# Patient Record
Sex: Female | Born: 1970 | Race: White | Hispanic: No | Marital: Married | Smoking: Never smoker
Health system: Southern US, Community
[De-identification: ages and names within clinical notes are randomized; demographics above are authoritative.]

## PROBLEM LIST (undated history)

## (undated) DIAGNOSIS — R112 Nausea with vomiting, unspecified: Secondary | ICD-10-CM

## (undated) DIAGNOSIS — Z9889 Other specified postprocedural states: Secondary | ICD-10-CM

## (undated) DIAGNOSIS — N920 Excessive and frequent menstruation with regular cycle: Secondary | ICD-10-CM

## (undated) DIAGNOSIS — E039 Hypothyroidism, unspecified: Secondary | ICD-10-CM

## (undated) DIAGNOSIS — R011 Cardiac murmur, unspecified: Secondary | ICD-10-CM

## (undated) DIAGNOSIS — F419 Anxiety disorder, unspecified: Secondary | ICD-10-CM

## (undated) DIAGNOSIS — K219 Gastro-esophageal reflux disease without esophagitis: Secondary | ICD-10-CM

## (undated) DIAGNOSIS — I341 Nonrheumatic mitral (valve) prolapse: Secondary | ICD-10-CM

## (undated) DIAGNOSIS — J45909 Unspecified asthma, uncomplicated: Secondary | ICD-10-CM

## (undated) DIAGNOSIS — K449 Diaphragmatic hernia without obstruction or gangrene: Secondary | ICD-10-CM

## (undated) DIAGNOSIS — E079 Disorder of thyroid, unspecified: Secondary | ICD-10-CM

## (undated) DIAGNOSIS — T7840XA Allergy, unspecified, initial encounter: Secondary | ICD-10-CM

## (undated) DIAGNOSIS — L52 Erythema nodosum: Secondary | ICD-10-CM

## (undated) DIAGNOSIS — M7660 Achilles tendinitis, unspecified leg: Secondary | ICD-10-CM

## (undated) HISTORY — DX: Excessive and frequent menstruation with regular cycle: N92.0

## (undated) HISTORY — PX: EYE SURGERY: SHX253

## (undated) HISTORY — DX: Anxiety disorder, unspecified: F41.9

## (undated) HISTORY — PX: ENDOMETRIAL ABLATION W/ NOVASURE: SUR434

## (undated) HISTORY — DX: Diaphragmatic hernia without obstruction or gangrene: K44.9

## (undated) HISTORY — DX: Gastro-esophageal reflux disease without esophagitis: K21.9

## (undated) HISTORY — DX: Allergy, unspecified, initial encounter: T78.40XA

## (undated) HISTORY — PX: APPENDECTOMY: SHX54

## (undated) HISTORY — DX: Unspecified asthma, uncomplicated: J45.909

## (undated) HISTORY — DX: Nonrheumatic mitral (valve) prolapse: I34.1

## (undated) HISTORY — DX: Erythema nodosum: L52

---

## 2006-06-03 ENCOUNTER — Ambulatory Visit: Payer: Self-pay | Admitting: Pulmonary Disease

## 2011-02-07 ENCOUNTER — Encounter: Payer: Self-pay | Admitting: Cardiology

## 2011-02-07 ENCOUNTER — Emergency Department (HOSPITAL_COMMUNITY)
Admission: EM | Admit: 2011-02-07 | Discharge: 2011-02-07 | Disposition: A | Discharge status: Home / Self Care | Payer: 59 | Source: Home / Self Care | Attending: Family Medicine | Admitting: Family Medicine

## 2011-02-07 DIAGNOSIS — L501 Idiopathic urticaria: Secondary | ICD-10-CM

## 2011-02-07 HISTORY — DX: Disorder of thyroid, unspecified: E07.9

## 2011-02-07 HISTORY — DX: Achilles tendinitis, unspecified leg: M76.60

## 2011-02-07 MED ORDER — TRIAMCINOLONE ACETONIDE 40 MG/ML IJ SUSP
40.0000 mg | Freq: Once | INTRAMUSCULAR | Status: AC
  Administered 2011-02-07: 40 mg via INTRAMUSCULAR

## 2011-02-07 MED ORDER — METHYLPREDNISOLONE ACETATE 40 MG/ML IJ SUSP
80.0000 mg | Freq: Once | INTRAMUSCULAR | Status: AC
  Administered 2011-02-07: 80 mg via INTRAMUSCULAR

## 2011-02-07 MED ORDER — HYDROXYZINE HCL 25 MG PO TABS: 25.0000 mg | ORAL_TABLET | Freq: Four times a day (QID) | ORAL | Status: AC

## 2011-02-07 MED ORDER — METHYLPREDNISOLONE ACETATE 80 MG/ML IJ SUSP
INTRAMUSCULAR | Status: AC
  Filled 2011-02-07: qty 1

## 2011-02-07 MED ORDER — TRIAMCINOLONE ACETONIDE 40 MG/ML IJ SUSP
INTRAMUSCULAR | Status: AC
  Filled 2011-02-07: qty 5

## 2011-02-07 NOTE — ED Provider Notes (Signed)
History     CSN: [id number] Arrival date & time: 02/07/2011  8:45 AM   First MD Initiated Contact with Patient 02/07/11 [phone]      Chief Complaint  Patient presents with  . Rash    (Consider location/radiation/quality/duration/timing/severity/associated sxs/prior treatment) Patient is a 40 y.o. female presenting with rash.  Rash  This is a new problem. The current episode started 2 days ago. The problem has not changed since onset.The problem is associated with an unknown (h/o similar episodes) factor. There has been no fever. The rash is present on the face, right hand, left hand and torso. The patient is experiencing no pain. Associated symptoms include itching. She has tried antihistamines for the symptoms. The treatment provided mild relief.    Past Medical History  Diagnosis Date  . Thyroid disease   . Asthma   . Achilles tendinitis   . Anemia     resolved    Past Surgical History  Procedure Date  . Endometrial ablation w/ novasure     Family History  Problem Relation Age of Onset  . Diabetes Other     History  Substance Use Topics  . Smoking status: Never Smoker   . Smokeless tobacco: Not on file  . Alcohol Use: Yes     occas    OB History    Grav Para Term Preterm Abortions TAB SAB Ect Mult Living                  Review of Systems  Constitutional: Negative.   HENT: Negative for trouble swallowing.   Respiratory: Negative for choking, chest tightness and shortness of breath.   Cardiovascular: Negative.   Skin: Positive for itching and rash.    Allergies  Penicillins and Sulfa antibiotics  Home Medications   Current Outpatient Rx  Name Route Sig Dispense Refill  . DIPHENHYDRAMINE HCL 50 MG PO TABS Oral Take 50 mg by mouth at bedtime as needed.      Marland Kitchen LEVOTHYROXINE SODIUM 112 MCG PO TABS Oral Take 112 mcg by mouth daily.      Marland Kitchen HYDROXYZINE HCL 25 MG PO TABS Oral Take 1 tablet (25 mg total) by mouth every 6 (six) hours. As needed for hives 30  tablet 0    BP 115/80  Pulse 78  Temp(Src) 98.7 F (37.1 C) (Oral)  Resp 18  SpO2 100%  Physical Exam  Constitutional: She appears well-developed and well-nourished.  HENT:  Head: Normocephalic.  Right Ear: External ear normal.  Left Ear: External ear normal.  Mouth/Throat: Oropharynx is clear and moist.  Cardiovascular: Normal rate, normal heart sounds and intact distal pulses.   Pulmonary/Chest: Effort normal and breath sounds normal. She has no wheezes.  Skin: Skin is warm and dry. Rash noted. Rash is urticarial.    ED Course  Procedures (including critical care time)  Labs Reviewed - No data to display No results found.   1. Urticaria, idiopathic       MDM          Barkley Bruns, MD 02/15/11 [phone]

## 2011-02-07 NOTE — ED Notes (Signed)
Pt reports rash to face, hands, and feet since 6pm Wednesday. Denies eating new food. Denies fever.

## 2012-08-24 ENCOUNTER — Encounter: Payer: Self-pay | Admitting: Certified Nurse Midwife

## 2012-08-25 ENCOUNTER — Encounter: Payer: Self-pay | Admitting: Certified Nurse Midwife

## 2012-08-25 ENCOUNTER — Ambulatory Visit (INDEPENDENT_AMBULATORY_CARE_PROVIDER_SITE_OTHER): Payer: 59 | Admitting: Certified Nurse Midwife

## 2012-08-25 VITALS — BP 90/62 | HR 64 | Ht 62.0 in | Wt 143.0 lb

## 2012-08-25 DIAGNOSIS — Z01419 Encounter for gynecological examination (general) (routine) without abnormal findings: Secondary | ICD-10-CM

## 2012-08-25 DIAGNOSIS — B373 Candidiasis of vulva and vagina: Secondary | ICD-10-CM

## 2012-08-25 MED ORDER — FLUCONAZOLE 150 MG PO TABS: 150.0000 mg | ORAL_TABLET | Freq: Once | ORAL | Status: DC

## 2012-08-25 NOTE — Patient Instructions (Addendum)

## 2012-08-25 NOTE — Progress Notes (Signed)
Patient ID: Sonya Snyder, female   DOB: 08-07-1970, 42 y.o.   MRN: [id number] 42 y.o. [id number] Married Caucasian Fe here for annual exam. Periods scant to known once or twice in the past year.  Experiencing increase discharge, no odor, itching or burning and also slight breast tenderness.?Period related. Recent long extended treated for bulging lumbar disc treated with steroids and pain medication. Improving now with physical therapy. Thyroid medication stable with PCP management. 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 Sees PCP for aex and labs.   No LMP recorded. Patient has had an ablation.          Sexually active: yes  The current method of family planning is ablation.    Exercising: no  The patient does not participate in regular exercise at present. Smoker:  no  Health Maintenance: Pap:  08/25/11 MMG:  never Colonoscopy:  2009, normal BMD:   never TDaP:  2004  Labs: PCP   reports that she has never smoked. She has never used smokeless tobacco. She reports that  drinks alcohol. She reports that she does not use illicit drugs.  Past Medical History  Diagnosis Date  . Asthma   . Achilles tendinitis   . Anemia     resolved  . Thyroid disease     hypothyroid  . MVP (mitral valve prolapse)   . Asthma     exercise induced  . Hiatal hernia   . GERD (gastroesophageal reflux disease)   . Erythema nodosum     with COC's  . Menorrhagia     Past Surgical History  Procedure Laterality Date  . Endometrial ablation w/ novasure    . Ablation  2010  . Appendectomy      Current Outpatient Prescriptions  Medication Sig Dispense Refill  . CALCIUM PO Take by mouth as needed.      . diphenhydrAMINE (BENADRYL) 50 MG tablet Take 50 mg by mouth at bedtime as needed.        Marland Kitchen levothyroxine (SYNTHROID, LEVOTHROID) 112 MCG tablet Take 112 mcg by mouth daily.        . Omeprazole (PRILOSEC PO) Take by mouth as needed.       No current facility-administered medications for this visit.     Family History  Problem Relation Age of Onset  . Diabetes Other   . Thyroid disease Mother   . Depression Mother   . Osteoporosis Mother   . Hypertension Father   . Diabetes Father   . Depression Father   . Thyroid disease Sister     ROS:  Pertinent items are noted in HPI.  Otherwise, a comprehensive ROS was negative.  Exam:   BP 90/62  Pulse 64  Ht 5\' 2"  (1.575 m)  Wt 143 lb (64.864 kg)  BMI 26.15 kg/m2 Height: 5\' 2"  (157.5 cm)  Ht Readings from Last 3 Encounters:  08/25/12 5\' 2"  (1.575 m)    General appearance: alert, cooperative and appears stated age Head: Normocephalic, without obvious abnormality, atraumatic Neck: no adenopathy, supple, symmetrical, trachea midline and thyroid normal to inspection and palpation Lungs: clear to auscultation bilaterally Breasts: normal appearance, no masses or tenderness, No nipple retraction or dimpling, No nipple discharge or bleeding, No axillary or supraclavicular adenopathy Heart: regular rate and rhythm Abdomen: soft, non-tender; no masses,  no organomegaly Extremities: extremities normal, atraumatic, no cyanosis or edema Skin: Skin color, texture, turgor normal. No rashes or lesions Lymph nodes: Cervical, supraclavicular, and axillary nodes normal. No abnormal inguinal nodes palpated Neurologic:  Grossly normal   Pelvic: External genitalia:  no lesions              Urethra:  normal appearing urethra with no masses, tenderness or lesions              Bartholin's and Skene's: normal                 Vagina: normal appearing vagina with normal color and thick white discharge, non odorous  discharge, no lesions  Wet prep taken              Cervix: normal, non tender              Pap taken: no Bimanual Exam:  Uterus:  normal size, contour, position, consistency, mobility, non-tender and anteverted              Adnexa: normal adnexa and no mass, fullness, tenderness               Rectovaginal: Confirms               Anus:   normal sphincter tone, no lesions  Wet Prep positive for yeast , negative BV,trich ph 4.0  A:  Well Woman with normal exam  Contraception none  Yeast vaginitis  P:   Reviewed health and wellness pertinent to exam  Discussed that pregnancy can still occur even though she has had an ablation, patient aware  Reviewed findings, Rx Diflucan see order  Pap smear as per guidelines   Mammogram yearly, information given to schedule pap smear not taken today  counseled on breast self exam, mammography screening, feminine hygiene, adequate intake of calcium and vitamin D, diet and exercise  return annually or prn  An After Visit Summary was printed and given to the patient.  Reviewed, TL

## 2013-08-03 ENCOUNTER — Encounter (HOSPITAL_BASED_OUTPATIENT_CLINIC_OR_DEPARTMENT_OTHER): Payer: Self-pay | Admitting: *Deleted

## 2013-08-03 NOTE — Pre-Procedure Instructions (Signed)
Bring all medications. Pack an overnight bag. 

## 2013-08-09 ENCOUNTER — Encounter (HOSPITAL_BASED_OUTPATIENT_CLINIC_OR_DEPARTMENT_OTHER): Payer: 59 | Admitting: Anesthesiology

## 2013-08-09 ENCOUNTER — Encounter (HOSPITAL_BASED_OUTPATIENT_CLINIC_OR_DEPARTMENT_OTHER)
Admission: RE | Disposition: A | Discharge status: Home / Self Care | Payer: Self-pay | Source: Ambulatory Visit | Attending: Otolaryngology

## 2013-08-09 ENCOUNTER — Encounter (HOSPITAL_BASED_OUTPATIENT_CLINIC_OR_DEPARTMENT_OTHER): Payer: Self-pay

## 2013-08-09 ENCOUNTER — Ambulatory Visit (HOSPITAL_BASED_OUTPATIENT_CLINIC_OR_DEPARTMENT_OTHER)
Admission: RE | Admit: 2013-08-09 | Discharge: 2013-08-10 | Disposition: A | Discharge status: Home / Self Care | Payer: 59 | Source: Ambulatory Visit | Attending: Otolaryngology | Admitting: Otolaryngology

## 2013-08-09 ENCOUNTER — Ambulatory Visit (HOSPITAL_BASED_OUTPATIENT_CLINIC_OR_DEPARTMENT_OTHER): Payer: 59 | Admitting: Anesthesiology

## 2013-08-09 DIAGNOSIS — H902 Conductive hearing loss, unspecified: Secondary | ICD-10-CM | POA: Insufficient documentation

## 2013-08-09 DIAGNOSIS — Z79899 Other long term (current) drug therapy: Secondary | ICD-10-CM | POA: Insufficient documentation

## 2013-08-09 DIAGNOSIS — K219 Gastro-esophageal reflux disease without esophagitis: Secondary | ICD-10-CM | POA: Insufficient documentation

## 2013-08-09 DIAGNOSIS — I059 Rheumatic mitral valve disease, unspecified: Secondary | ICD-10-CM | POA: Insufficient documentation

## 2013-08-09 DIAGNOSIS — H8090 Unspecified otosclerosis, unspecified ear: Secondary | ICD-10-CM | POA: Diagnosis present

## 2013-08-09 DIAGNOSIS — E039 Hypothyroidism, unspecified: Secondary | ICD-10-CM | POA: Insufficient documentation

## 2013-08-09 HISTORY — DX: Other specified postprocedural states: Z98.890

## 2013-08-09 HISTORY — PX: STAPEDECTOMY: SHX2435

## 2013-08-09 HISTORY — DX: Other specified postprocedural states: R11.2

## 2013-08-09 HISTORY — DX: Cardiac murmur, unspecified: R01.1

## 2013-08-09 HISTORY — DX: Hypothyroidism, unspecified: E03.9

## 2013-08-09 SURGERY — STAPEDECTOMY
Anesthesia: General | Site: Ear | Laterality: Left

## 2013-08-09 MED ORDER — HYDROMORPHONE HCL PF 1 MG/ML IJ SOLN
INTRAMUSCULAR | Status: AC
  Filled 2013-08-09: qty 1

## 2013-08-09 MED ORDER — CIPROFLOXACIN-DEXAMETHASONE 0.3-0.1 % OT SUSP
OTIC | Status: AC
Start: 2013-08-09 — End: 2013-08-09
  Filled 2013-08-09: qty 7.5

## 2013-08-09 MED ORDER — OXYCODONE HCL 5 MG/5ML PO SOLN: 5.0000 mg | Freq: Once | ORAL | Status: DC | PRN

## 2013-08-09 MED ORDER — ROCURONIUM BROMIDE 100 MG/10ML IV SOLN
INTRAVENOUS | Status: DC | PRN
  Administered 2013-08-09: 50 mg via INTRAVENOUS

## 2013-08-09 MED ORDER — IBUPROFEN 100 MG/5ML PO SUSP: 400.0000 mg | Freq: Four times a day (QID) | ORAL | Status: DC | PRN

## 2013-08-09 MED ORDER — BACITRACIN ZINC 500 UNIT/GM EX OINT
TOPICAL_OINTMENT | CUTANEOUS | Status: AC
Start: 2013-08-09 — End: 2013-08-09
  Filled 2013-08-09: qty 28.35

## 2013-08-09 MED ORDER — HYDROCODONE-ACETAMINOPHEN 5-325 MG PO TABS
1.0000 | ORAL_TABLET | ORAL | Status: DC | PRN
  Administered 2013-08-09 (×3): 2 via ORAL
  Filled 2013-08-09 (×3): qty 2

## 2013-08-09 MED ORDER — LIDOCAINE-EPINEPHRINE 1 %-1:100000 IJ SOLN
INTRAMUSCULAR | Status: AC
  Filled 2013-08-09: qty 1

## 2013-08-09 MED ORDER — FENTANYL CITRATE 0.05 MG/ML IJ SOLN: 50.0000 ug | INTRAMUSCULAR | Status: DC | PRN

## 2013-08-09 MED ORDER — LEVOTHYROXINE SODIUM 112 MCG PO TABS: 112.0000 ug | ORAL_TABLET | Freq: Every day | ORAL | Status: DC

## 2013-08-09 MED ORDER — EPINEPHRINE HCL 1 MG/ML IJ SOLN
INTRAMUSCULAR | Status: AC
Start: 2013-08-09 — End: 2013-08-09
  Filled 2013-08-09: qty 1

## 2013-08-09 MED ORDER — ONDANSETRON HCL 4 MG/2ML IJ SOLN
INTRAMUSCULAR | Status: DC | PRN
  Administered 2013-08-09 (×2): 4 mg via INTRAVENOUS

## 2013-08-09 MED ORDER — MIDAZOLAM HCL 2 MG/2ML IJ SOLN: 1.0000 mg | INTRAMUSCULAR | Status: DC | PRN

## 2013-08-09 MED ORDER — LACTATED RINGERS IV SOLN
INTRAVENOUS | Status: DC | PRN
  Administered 2013-08-09 (×2): via INTRAVENOUS

## 2013-08-09 MED ORDER — FENTANYL CITRATE 0.05 MG/ML IJ SOLN
INTRAMUSCULAR | Status: AC
  Filled 2013-08-09: qty 6

## 2013-08-09 MED ORDER — PROMETHAZINE HCL 25 MG RE SUPP
25.0000 mg | Freq: Four times a day (QID) | RECTAL | Status: DC | PRN
Start: 2013-08-09 — End: 2013-08-10

## 2013-08-09 MED ORDER — BACITRACIN ZINC 500 UNIT/GM EX OINT
TOPICAL_OINTMENT | CUTANEOUS | Status: DC | PRN
  Administered 2013-08-09: 1 via TOPICAL

## 2013-08-09 MED ORDER — DEXAMETHASONE SODIUM PHOSPHATE 4 MG/ML IJ SOLN
INTRAMUSCULAR | Status: DC | PRN
  Administered 2013-08-09: 10 mg via INTRAVENOUS

## 2013-08-09 MED ORDER — LACTATED RINGERS IV SOLN
INTRAVENOUS | Status: DC
  Administered 2013-08-09: 12:00:00 via INTRAVENOUS

## 2013-08-09 MED ORDER — METHYLENE BLUE 1 % INJ SOLN
INTRAMUSCULAR | Status: DC | PRN
  Administered 2013-08-09: 1 mL

## 2013-08-09 MED ORDER — DEXTROSE-NACL 5-0.9 % IV SOLN
INTRAVENOUS | Status: DC
  Administered 2013-08-09: 14:00:00 via INTRAVENOUS

## 2013-08-09 MED ORDER — PROMETHAZINE HCL 25 MG PO TABS
25.0000 mg | ORAL_TABLET | Freq: Four times a day (QID) | ORAL | Status: DC | PRN
  Administered 2013-08-09: 25 mg via ORAL
  Filled 2013-08-09: qty 1

## 2013-08-09 MED ORDER — FENTANYL CITRATE 0.05 MG/ML IJ SOLN
INTRAMUSCULAR | Status: DC | PRN
  Administered 2013-08-09: 100 ug via INTRAVENOUS

## 2013-08-09 MED ORDER — DIAZEPAM 5 MG PO TABS
5.0000 mg | ORAL_TABLET | Freq: Four times a day (QID) | ORAL | Status: DC | PRN
  Administered 2013-08-09 – 2013-08-10 (×2): 5 mg via ORAL
  Filled 2013-08-09 (×2): qty 1

## 2013-08-09 MED ORDER — OXYCODONE HCL 5 MG PO TABS: 5.0000 mg | ORAL_TABLET | Freq: Once | ORAL | Status: DC | PRN

## 2013-08-09 MED ORDER — ONDANSETRON HCL 4 MG/2ML IJ SOLN: 4.0000 mg | Freq: Once | INTRAMUSCULAR | Status: DC | PRN

## 2013-08-09 MED ORDER — HYDROMORPHONE HCL PF 1 MG/ML IJ SOLN
0.2500 mg | INTRAMUSCULAR | Status: DC | PRN
  Administered 2013-08-09: 0.5 mg via INTRAVENOUS

## 2013-08-09 MED ORDER — LIDOCAINE-EPINEPHRINE 1 %-1:100000 IJ SOLN
INTRAMUSCULAR | Status: DC | PRN
  Administered 2013-08-09: .5 mL

## 2013-08-09 MED ORDER — SCOPOLAMINE 1 MG/3DAYS TD PT72
1.0000 | MEDICATED_PATCH | TRANSDERMAL | Status: DC
  Administered 2013-08-09: 1.5 mg via TRANSDERMAL

## 2013-08-09 MED ORDER — PANTOPRAZOLE SODIUM 40 MG PO TBEC
40.0000 mg | DELAYED_RELEASE_TABLET | Freq: Every day | ORAL | Status: DC
Start: 2013-08-09 — End: 2013-08-10

## 2013-08-09 MED ORDER — MIDAZOLAM HCL 5 MG/5ML IJ SOLN
INTRAMUSCULAR | Status: DC | PRN
  Administered 2013-08-09: 2 mg via INTRAVENOUS

## 2013-08-09 MED ORDER — NEOSTIGMINE METHYLSULFATE 10 MG/10ML IV SOLN
INTRAVENOUS | Status: DC | PRN
  Administered 2013-08-09: 3 mg via INTRAVENOUS

## 2013-08-09 MED ORDER — CIPROFLOXACIN-DEXAMETHASONE 0.3-0.1 % OT SUSP
OTIC | Status: DC | PRN
  Administered 2013-08-09: 4 [drp] via OTIC

## 2013-08-09 MED ORDER — SCOPOLAMINE 1 MG/3DAYS TD PT72
MEDICATED_PATCH | TRANSDERMAL | Status: AC
  Filled 2013-08-09: qty 1

## 2013-08-09 MED ORDER — EPINEPHRINE HCL 1 MG/ML IJ SOLN
INTRAMUSCULAR | Status: DC | PRN
  Administered 2013-08-09: 1 mg

## 2013-08-09 MED ORDER — METHYLENE BLUE 1 % INJ SOLN
INTRAMUSCULAR | Status: AC
Start: 2013-08-09 — End: 2013-08-09
  Filled 2013-08-09: qty 10

## 2013-08-09 MED ORDER — PROPOFOL 10 MG/ML IV BOLUS
INTRAVENOUS | Status: DC | PRN
  Administered 2013-08-09: 180 mg via INTRAVENOUS

## 2013-08-09 MED ORDER — LIDOCAINE HCL (CARDIAC) 20 MG/ML IV SOLN
INTRAVENOUS | Status: DC | PRN
  Administered 2013-08-09: 80 mg via INTRAVENOUS

## 2013-08-09 MED ORDER — GLYCOPYRROLATE 0.2 MG/ML IJ SOLN
INTRAMUSCULAR | Status: DC | PRN
  Administered 2013-08-09: 0.4 mg via INTRAVENOUS

## 2013-08-09 MED ORDER — MIDAZOLAM HCL 2 MG/2ML IJ SOLN
INTRAMUSCULAR | Status: AC
  Filled 2013-08-09: qty 2

## 2013-08-09 SURGICAL SUPPLY — 40 items
ADH SKN CLS APL DERMABOND .7 (GAUZE/BANDAGES/DRESSINGS)
BALL CTTN LRG ABS STRL LF (GAUZE/BANDAGES/DRESSINGS) ×1
BLADE SURG ROTATE 9660 (MISCELLANEOUS) IMPLANT
CANISTER SUCT 1200ML W/VALVE (MISCELLANEOUS) ×2 IMPLANT
CLEANER CAUTERY TIP 5X5 PAD (MISCELLANEOUS) IMPLANT
COTTONBALL LRG STERILE PKG (GAUZE/BANDAGES/DRESSINGS) ×2 IMPLANT
DECANTER SPIKE VIAL GLASS SM (MISCELLANEOUS) ×2 IMPLANT
DERMABOND ADVANCED (GAUZE/BANDAGES/DRESSINGS)
DERMABOND ADVANCED .7 DNX12 (GAUZE/BANDAGES/DRESSINGS) IMPLANT
DRAPE MICROSCOPE URBAN (DRAPES) ×2 IMPLANT
DROPPER MEDICINE STER 1.5ML LF (MISCELLANEOUS) IMPLANT
ELECT COATED BLADE 2.86 ST (ELECTRODE) IMPLANT
ELECT REM PT RETURN 9FT ADLT (ELECTROSURGICAL) ×2
ELECTRODE REM PT RTRN 9FT ADLT (ELECTROSURGICAL) IMPLANT
GLOVE ECLIPSE 7.5 STRL STRAW (GLOVE) ×2 IMPLANT
GLOVE SURG SS PI 7.0 STRL IVOR (GLOVE) ×1 IMPLANT
GOWN STRL REUS W/ TWL LRG LVL3 (GOWN DISPOSABLE) ×1 IMPLANT
GOWN STRL REUS W/ TWL XL LVL3 (GOWN DISPOSABLE) ×1 IMPLANT
GOWN STRL REUS W/TWL LRG LVL3 (GOWN DISPOSABLE) ×2
GOWN STRL REUS W/TWL XL LVL3 (GOWN DISPOSABLE) ×2
IV CATH AUTO 14GX1.75 SAFE ORG (IV SOLUTION) IMPLANT
NDL SAFETY ECLIPSE 18X1.5 (NEEDLE) ×1 IMPLANT
NEEDLE 27GAX1X1/2 (NEEDLE) ×2 IMPLANT
NEEDLE HYPO 18GX1.5 SHARP (NEEDLE) ×2
NS IRRIG 1000ML POUR BTL (IV SOLUTION) ×2 IMPLANT
PACK BASIN DAY SURGERY FS (CUSTOM PROCEDURE TRAY) ×2 IMPLANT
PACK ENT DAY SURGERY (CUSTOM PROCEDURE TRAY) ×2 IMPLANT
PAD CLEANER CAUTERY TIP 5X5 (MISCELLANEOUS)
PENCIL FOOT CONTROL (ELECTRODE) IMPLANT
PISTON CUP LIPPY MOD .4X4.0 SS ×1 IMPLANT
SET EXT MALE ROTATING LL 32IN (MISCELLANEOUS) ×2 IMPLANT
SET IV EXT TUBING FEMALE 31 (MISCELLANEOUS) ×1 IMPLANT
SHEET MEDIUM DRAPE 40X70 STRL (DRAPES) IMPLANT
SLEEVE SCD COMPRESS KNEE MED (MISCELLANEOUS) ×2 IMPLANT
SPONGE SURGIFOAM ABS GEL 12-7 (HEMOSTASIS) IMPLANT
SUT CHROMIC 4 0 P 3 18 (SUTURE) IMPLANT
SUT PLAIN 5 0 P 3 18 (SUTURE) IMPLANT
TOWEL OR 17X24 6PK STRL BLUE (TOWEL DISPOSABLE) ×4 IMPLANT
TOWEL OR NON WOVEN STRL DISP B (DISPOSABLE) ×2 IMPLANT
TRAY DSU PREP LF (CUSTOM PROCEDURE TRAY) ×2 IMPLANT

## 2013-08-09 NOTE — Progress Notes (Signed)
Dr. Pollyann Kennedy  here to examine pt with tuning fork.   Pt, able to hear equally in both ears.

## 2013-08-09 NOTE — Anesthesia Preprocedure Evaluation (Addendum)
Anesthesia Evaluation  Patient identified by MRN, date of birth, ID band Patient awake    Reviewed: Allergy & Precautions, H&P , NPO status , Patient's Chart, lab work & pertinent test results  History of Anesthesia Complications (+) PONV  Airway Mallampati: I TM Distance: >3 FB Neck ROM: Full    Dental  (+) Teeth Intact, Dental Advisory Given   Pulmonary  breath sounds clear to auscultation        Cardiovascular Valvular problems/murmurs: MVP without sx. Rhythm:Regular     Neuro/Psych    GI/Hepatic   Endo/Other    Renal/GU      Musculoskeletal   Abdominal   Peds  Hematology   Anesthesia Other Findings   Reproductive/Obstetrics                          Anesthesia Physical Anesthesia Plan  ASA: II  Anesthesia Plan: General   Post-op Pain Management:    Induction: Intravenous  Airway Management Planned: LMA  Additional Equipment:   Intra-op Plan:   Post-operative Plan: Extubation in OR  Informed Consent: I have reviewed the patients History and Physical, chart, labs and discussed the procedure including the risks, benefits and alternatives for the proposed anesthesia with the patient or authorized representative who has indicated his/her understanding and acceptance.   Dental advisory given  Plan Discussed with: CRNA, Anesthesiologist and Surgeon  Anesthesia Plan Comments:         Anesthesia Quick Evaluation

## 2013-08-09 NOTE — H&P (Signed)
Assessment  Hearing loss (389.9) (H91.90). Tinnitus (388.30) (H93.19). Orders  Audiological Evaluation; Comprehensive Audiometry; Tympanometry Bilateral; Tympanometry Bilateral; Requested for: 20 Jun 2013. Discussed  The patient presents for evaluation of left hearing loss. She reports a 2 month history of left ear fullness, hearing loss and nonpulsatile tinnitus which is worse at night. No prior otologic history, previous surgery, hearing loss, otalgia or otorrhea. She had a recent upper respiratory tract infection which resolved spontaneously without antibiotics. She has a history of allergic rhinitis but has not been particularly bothered this year. No new medications, medical issues are stable. No family history of hearing loss or prior otologic concerns. She is employed at [employer] and a recent audiogram showed left hearing loss. No neurologic symptoms or headache. Exam shows normal external auditory canals and tympanic membranes, no middle ear effusion or infection. Normal neurologic exam. Audiogram shows maximal left conductive hearing loss across all frequencies with normal sensorineural hearing, normal speech discrimination and normal tympanograms.   Patient presents with left hearing loss, history findings and audiogram are consistent with significant left conductive hearing loss, this may represent otosclerosis or ossicular discontinuity. Discussed these findings, recommend evaluation by Dr. Pollyann Kennedy for middle ear exploration and possible stapedectomy. The patient will contact our office to schedule appointment. Followup with me as needed. Reason For Visit  Patient is being seen for left ear discomfort. Allergies  Penicillins Sulfa Antibiotics. Current Meds  Benadryl 25 MG Oral Tablet;TAKE 1 TABLET AT BEDTIME.; RPT Synthroid 112 MCG Oral Tablet (Levothyroxine Sodium);TAKE 1 TABLET DAILY.; RPT Ibuprofen TABS;TAKES PRN; RPT Omeprazole TBEC;Take one capsule daily; RPT. Active Problems   Gastroesophageal reflux disease   (530.81) (K21.9) Hearing loss   (389.9) (H91.90) Hypothyroidism   (244.9) (E03.9) MVP (mitral valve prolapse)   (424.0) (I34.1). PMH  History of endometrial ablation (V12.59) (Z98.89) History of erythema nodosum (V13.3) (Z87.2). PSH  Appendectomy. Family Hx  Family history of deafness or hearing loss: Father,Other (V19.2) (Z82.2) Family history of diabetes mellitus: Father,Other (V18.0) (Z83.3) Family history of malignant neoplasm of prostate: Father,Other (L79.89) (Z80.42) Seasonal allergies: Other (J30.2). Personal Hx  Caffeine use (V49.89) (Z78.9) Never smoker Social alcohol use (F10.99). ROS  12 system ROS was obtained and reviewed on the Health Maintenance form dated today.  Positive responses are shown above.  If the symptom is not checked, the patient has denied it. Vital Signs   Recorded by [facility] on 20 Jun 2013 09:41 AM BP:130/78,  Height: 5 ft 2 in, Weight: 135 lb , BMI: 24.7 kg/m2,  BMI Calculated: 24.69 ,  BSA Calculated: 1.62. Physical Exam  Constitutional:  Patient appears well-nourished and well-developed. No acute distress.   Head/Face: Facial features are symmetric. Skull is normocephalic. Hair and scalp are normal. Normal temporal artery pulses. TMJ shows no joint deformity swelling or erythema.   Eyes: Pupils are equal, round and reactive to light. Conjunctiva and lids are normal. Normal extraocular mobility. Normal vision by patient report.   Ears:     Right: Pinna and external meatus normal, normal ear canal skin and caliber without excessive cerumen or drainage. Tympanic membranes intact without effusion or infection. Hearing normal.    Left: Pinna and external meatus normal, normal ear canal skin and caliber without excessive cerumen or drainage. Tympanic membranes intact without effusion or infection. Hearing normal.   Nose/Sinus/Nasopharynx: Septum is normal. Normal nasal mucosa. Normal inferior  turbinates. Normal middle and superior turbinates, sinus ostia patent without obstruction, mass or discharge. Nasopharynx patent.   Oral cavity/Oropharynx: Lips normal,  teeth and gums normal with good dentition, normal oral vestibule. Normal floor of mouth, tongue and oral mucosa, no mucosal lesions, ulcer or mass, normal tongue mobility.  Hard and soft palate normal with normal mobility. Tonsils normal. Base of tongue, retromolar trigone and oral pharynx normal. Normal sensation, mobility and gag.   Neck: No cervical lymphadenopathy, mass or swelling. Salivary glands normal to palpation without swelling, erythema or mass. Normal facial nerve function. Normal thyroid gland palpation.   Neurological: Alert and oriented to self, place and time.  Normal reflexes and motor skills, balance and coordination.   Psychiatric: No unusual anxiety or evidence of depression. Appropriate affect. . Results  Audiogram shows maximal left conductive hearing loss across all frequencies with normal sensorineural hearing, normal speech discrimination and normal tympanograms. Signature  Electronically signed by : Osborn Cohoavid  Shoemaker  M.D.; 06/20/2013 11:04 AM EST.

## 2013-08-09 NOTE — Transfer of Care (Signed)
Immediate Anesthesia Transfer of Care Note  Patient: Sonya Snyder  Procedure(s) Performed: Procedure(s): LEFT STAPEDECTOMY (Left)  Patient Location: PACU  Anesthesia Type:General  Level of Consciousness: awake and alert   Airway & Oxygen Therapy: Patient Spontanous Breathing and Patient connected to face mask oxygen  Post-op Assessment: Report given to PACU RN and Post -op Vital signs reviewed and stable  Post vital signs: Reviewed and stable  Complications: No apparent anesthesia complications

## 2013-08-09 NOTE — Anesthesia Postprocedure Evaluation (Signed)
  Anesthesia Post-op Note  Patient: Sonya Snyder  Procedure(s) Performed: Procedure(s): LEFT STAPEDECTOMY (Left)  Patient Location: PACU  Anesthesia Type:General  Level of Consciousness: awake, alert  and oriented  Airway and Oxygen Therapy: Patient Spontanous Breathing  Post-op Pain: none  Post-op Assessment: Post-op Vital signs reviewed  Post-op Vital Signs: Reviewed  Last Vitals:  Filed Vitals:   08/09/13 1315  BP: 110/61  Pulse: 58  Temp:   Resp: 12    Complications: No apparent anesthesia complications

## 2013-08-09 NOTE — Op Note (Signed)
OPERATIVE REPORT  DATE OF SURGERY: 08/09/2013  PATIENT:  Sonya Snyder,  43 y.o. female  PRE-OPERATIVE DIAGNOSIS:  CONDUCTIVE HEARING LOSS LEFT OTOSCLEROSIS   POST-OPERATIVE DIAGNOSIS:  CONDUCTIVE HEARING LOSS LEFT OTOSCLEROSIS   PROCEDURE:  Procedure(s): LEFT STAPEDECTOMY  SURGEON:  Susy Frizzle, MD  ASSISTANTS: None  ANESTHESIA:   General   EBL:  5 ml  DRAINS: None   LOCAL MEDICATIONS USED:  1% Xylocaine with epinephrine  SPECIMEN:  Left stapes superstructure  COUNTS:  Correct  PROCEDURE DETAILS: The patient was taken to the operating room and placed on the operating table in the supine position. Following induction of general endotracheal anesthesia, the left ear was prepped and draped in a standard fashion. Xylocaine with epinephrine was infiltrated into 4 quadrants of the external auditory canal. The tragus was also injected. A posteriorly-based tympanomeatal flap was developed with a round knife and brought forward entering the middle ear. The chorda tympani nerve was identified and preserved. The middle ear was clear and healthy. The ossicular chain was inspected and the malleus and incus were freely mobile but the stapes was fixed. A tragal perichondrial graft was harvested. The incision was reapproximated with chromic suture. A curette was used to remove bone off the scutum exposing the oval window. The facial nerve was overhanging and partially obscuring the footplate but there was adequate room for completion of the surgery. Mucosa was dissected off of the fallopian canal, the footplate and the promontory. A small control was created in the center of the footplate. There is no perilymph gusher. The incus stapedial joint was divided. The stapedial muscle tendon was cut. Eustachian tube area the anterior tympanic cavity was packed with saline soaked Gelfoam  pieces. The superstructure was down fractured toward the promontory and removed. The footplate was removed in piecemeal  fashion using a tab knife. The perichondrial graft was put into position overlying the oval window. A 4 mm total length modified bucket-handle prosthesis was then inserted without difficulty and secured in place. There was nice round window reflex with movement of the incus. The flap was brought back to its native position and secured in place with Ciprodex-soaked Gelfoam. Cotton ball with bacitracin was placed at the external meatus. Patient was awakened extubated and transferred to recovery in stable condition.    PATIENT DISPOSITION:  To PACU, stable

## 2013-08-09 NOTE — Anesthesia Procedure Notes (Signed)
Procedure Name: Intubation Date/Time: 08/09/2013 11:55 AM Performed by: Tyrone Nine Pre-anesthesia Checklist: Patient identified, Timeout performed, Emergency Drugs available, Suction available and Patient being monitored Patient Re-evaluated:Patient Re-evaluated prior to inductionOxygen Delivery Method: Circle system utilized Preoxygenation: Pre-oxygenation with 100% oxygen Intubation Type: IV induction Ventilation: Mask ventilation without difficulty Laryngoscope Size: Mac and 3 Grade View: Grade I Tube size: 7.0 mm Number of attempts: 1 Airway Equipment and Method: Stylet Placement Confirmation: ETT inserted through vocal cords under direct vision,  positive ETCO2 and breath sounds checked- equal and bilateral Secured at: 20 cm Tube secured with: Tape Dental Injury: Teeth and Oropharynx as per pre-operative assessment

## 2013-08-09 NOTE — Discharge Instructions (Signed)
Do not blow your nose.  No straining or lifting anything greater than about 5 pounds  Do not allow water to get into the ear.  Change the cotton ball 3 times daily and instilled eardrops.  Open your mouth if you feel a sneeze coming.   Post Anesthesia Home Care Instructions  Activity: Get plenty of rest for the remainder of the day. A responsible adult should stay with you for 24 hours following the procedure.  For the next 24 hours, DO NOT: -Drive a car -Advertising copywriter -Drink alcoholic beverages -Take any medication unless instructed by your physician -Make any legal decisions or sign important papers.  Meals: Start with liquid foods such as gelatin or soup. Progress to regular foods as tolerated. Avoid greasy, spicy, heavy foods. If nausea and/or vomiting occur, drink only clear liquids until the nausea and/or vomiting subsides. Call your physician if vomiting continues.  Special Instructions/Symptoms: Your throat may feel dry or sore from the anesthesia or the breathing tube placed in your throat during surgery. If this causes discomfort, gargle with warm salt water. The discomfort should disappear within 24 hours.

## 2013-08-10 ENCOUNTER — Encounter (HOSPITAL_BASED_OUTPATIENT_CLINIC_OR_DEPARTMENT_OTHER): Payer: Self-pay | Admitting: Otolaryngology

## 2013-08-10 LAB — POCT HEMOGLOBIN-HEMACUE: Hemoglobin: 14.3 g/dL (ref 12.0–15.0)

## 2013-08-10 MED ORDER — PROMETHAZINE HCL 25 MG RE SUPP: 25.0000 mg | Freq: Four times a day (QID) | RECTAL | Status: DC | PRN

## 2013-08-10 MED ORDER — HYDROCODONE-ACETAMINOPHEN 7.5-325 MG PO TABS: 1.0000 | ORAL_TABLET | Freq: Four times a day (QID) | ORAL | Status: DC | PRN

## 2013-08-10 MED ORDER — CIPROFLOXACIN-DEXAMETHASONE 0.3-0.1 % OT SUSP: 3.0000 [drp] | Freq: Three times a day (TID) | OTIC | Status: DC

## 2013-08-10 MED ORDER — DIAZEPAM 5 MG PO TABS: 5.0000 mg | ORAL_TABLET | Freq: Four times a day (QID) | ORAL | Status: DC | PRN

## 2013-08-10 NOTE — Discharge Summary (Signed)
Physician Discharge Summary  Patient ID: Sonya Snyder MRN: [id number] DOB/AGE: May 27, 1970 42 y.o.  Admit date: 08/09/2013 Discharge date: 08/10/2013  Admission Diagnoses:Otosclerosis  Discharge Diagnoses:  Active Problems:   Otosclerosis   Discharged Condition: good  Hospital Course: no complications, minimal dizziness.  Consults: none  Significant Diagnostic Studies: none  Treatments: surgery: Stapedectomy  Discharge Exam: Blood pressure 96/59, pulse 68, temperature 97.3 F (36.3 C), temperature source Oral, resp. rate 16, height 5\' 2"  (1.575 m), weight 139 lb (63.05 kg), SpO2 99.00%. PHYSICAL EXAM: No nystagmus, awake and alert.   Disposition: 01-Home or Self Care  Discharge Instructions   Diet - low sodium heart healthy    Complete by:  As directed      Increase activity slowly    Complete by:  As directed             Medication List         ALBUTEROL IN  Inhale into the lungs. Use as needed     ciprofloxacin-dexamethasone otic suspension  Commonly known as:  CIPRODEX  Place 3 drops into the left ear 3 (three) times daily.     diazepam 5 MG tablet  Commonly known as:  VALIUM  Take 1 tablet (5 mg total) by mouth every 6 (six) hours as needed for anxiety.     diphenhydrAMINE 50 MG tablet  Commonly known as:  BENADRYL  Take 50 mg by mouth at bedtime as needed.     HYDROcodone-acetaminophen 7.5-325 MG per tablet  Commonly known as:  NORCO  Take 1 tablet by mouth every 6 (six) hours as needed for moderate pain.     levothyroxine 112 MCG tablet  Commonly known as:  SYNTHROID, LEVOTHROID  Take 112 mcg by mouth daily.     PRILOSEC PO  Take by mouth as needed.     promethazine 25 MG suppository  Commonly known as:  PHENERGAN  Place 1 suppository (25 mg total) rectally every 6 (six) hours as needed for nausea or vomiting.     SUDAFED 12 HOUR PO  Take by mouth as needed.           Follow-up Information   Follow up with Serena Colonel, MD.  Schedule an appointment as soon as possible for a visit on 08/19/2013.   Specialty:  Otolaryngology   Contact information:   [address] [place] [address] [phone]       Signed: Serena Colonel 08/10/2013, 8:11 AM

## 2013-08-30 ENCOUNTER — Ambulatory Visit: Payer: 59 | Admitting: Certified Nurse Midwife

## 2013-12-16 ENCOUNTER — Other Ambulatory Visit: Payer: Self-pay

## 2014-01-02 ENCOUNTER — Encounter (HOSPITAL_BASED_OUTPATIENT_CLINIC_OR_DEPARTMENT_OTHER): Payer: Self-pay | Admitting: Otolaryngology

## 2016-01-03 ENCOUNTER — Other Ambulatory Visit: Payer: Self-pay | Admitting: Internal Medicine

## 2016-01-03 DIAGNOSIS — Z1231 Encounter for screening mammogram for malignant neoplasm of breast: Secondary | ICD-10-CM

## 2016-02-04 ENCOUNTER — Ambulatory Visit
Admission: RE | Admit: 2016-02-04 | Discharge: 2016-02-04 | Disposition: A | Discharge status: Home / Self Care | Payer: 59 | Source: Ambulatory Visit | Attending: Internal Medicine | Admitting: Internal Medicine

## 2016-02-04 DIAGNOSIS — Z1231 Encounter for screening mammogram for malignant neoplasm of breast: Secondary | ICD-10-CM

## 2016-03-04 DIAGNOSIS — Z0000 Encounter for general adult medical examination without abnormal findings: Secondary | ICD-10-CM | POA: Diagnosis not present

## 2016-03-04 DIAGNOSIS — R7309 Other abnormal glucose: Secondary | ICD-10-CM | POA: Diagnosis not present

## 2016-03-04 DIAGNOSIS — E038 Other specified hypothyroidism: Secondary | ICD-10-CM | POA: Diagnosis not present

## 2016-03-11 DIAGNOSIS — Z0000 Encounter for general adult medical examination without abnormal findings: Secondary | ICD-10-CM | POA: Diagnosis not present

## 2016-03-11 DIAGNOSIS — J3089 Other allergic rhinitis: Secondary | ICD-10-CM | POA: Diagnosis not present

## 2016-03-11 DIAGNOSIS — E038 Other specified hypothyroidism: Secondary | ICD-10-CM | POA: Diagnosis not present

## 2016-03-11 DIAGNOSIS — L52 Erythema nodosum: Secondary | ICD-10-CM | POA: Diagnosis not present

## 2016-03-11 DIAGNOSIS — Z23 Encounter for immunization: Secondary | ICD-10-CM | POA: Diagnosis not present

## 2016-10-21 DIAGNOSIS — W57XXXA Bitten or stung by nonvenomous insect and other nonvenomous arthropods, initial encounter: Secondary | ICD-10-CM | POA: Diagnosis not present

## 2016-10-21 DIAGNOSIS — L03116 Cellulitis of left lower limb: Secondary | ICD-10-CM | POA: Diagnosis not present

## 2017-02-04 DIAGNOSIS — L52 Erythema nodosum: Secondary | ICD-10-CM | POA: Diagnosis not present

## 2017-02-04 DIAGNOSIS — M2550 Pain in unspecified joint: Secondary | ICD-10-CM | POA: Diagnosis not present

## 2017-03-10 DIAGNOSIS — Z0000 Encounter for general adult medical examination without abnormal findings: Secondary | ICD-10-CM | POA: Diagnosis not present

## 2017-03-10 DIAGNOSIS — E038 Other specified hypothyroidism: Secondary | ICD-10-CM | POA: Diagnosis not present

## 2017-03-17 DIAGNOSIS — Z23 Encounter for immunization: Secondary | ICD-10-CM | POA: Diagnosis not present

## 2017-03-17 DIAGNOSIS — E038 Other specified hypothyroidism: Secondary | ICD-10-CM | POA: Diagnosis not present

## 2017-03-17 DIAGNOSIS — Z0000 Encounter for general adult medical examination without abnormal findings: Secondary | ICD-10-CM | POA: Diagnosis not present

## 2017-03-17 DIAGNOSIS — L52 Erythema nodosum: Secondary | ICD-10-CM | POA: Diagnosis not present

## 2017-03-17 DIAGNOSIS — Z1389 Encounter for screening for other disorder: Secondary | ICD-10-CM | POA: Diagnosis not present

## 2017-03-17 DIAGNOSIS — J45990 Exercise induced bronchospasm: Secondary | ICD-10-CM | POA: Diagnosis not present

## 2017-04-24 DIAGNOSIS — Z124 Encounter for screening for malignant neoplasm of cervix: Secondary | ICD-10-CM | POA: Diagnosis not present

## 2017-05-08 DIAGNOSIS — N736 Female pelvic peritoneal adhesions (postinfective): Secondary | ICD-10-CM | POA: Diagnosis not present

## 2017-05-08 DIAGNOSIS — N813 Complete uterovaginal prolapse: Secondary | ICD-10-CM | POA: Diagnosis not present

## 2017-05-08 DIAGNOSIS — R197 Diarrhea, unspecified: Secondary | ICD-10-CM | POA: Diagnosis not present

## 2017-05-08 DIAGNOSIS — E039 Hypothyroidism, unspecified: Secondary | ICD-10-CM | POA: Diagnosis not present

## 2017-05-08 DIAGNOSIS — N393 Stress incontinence (female) (male): Secondary | ICD-10-CM | POA: Diagnosis not present

## 2017-05-08 HISTORY — PX: VAGINAL HYSTERECTOMY: SUR661

## 2017-06-04 DIAGNOSIS — R8290 Unspecified abnormal findings in urine: Secondary | ICD-10-CM | POA: Diagnosis not present

## 2017-06-04 DIAGNOSIS — M62838 Other muscle spasm: Secondary | ICD-10-CM | POA: Diagnosis not present

## 2017-06-11 ENCOUNTER — Other Ambulatory Visit: Payer: Self-pay

## 2017-06-11 ENCOUNTER — Ambulatory Visit: Payer: Commercial Managed Care - PPO | Attending: Obstetrics and Gynecology | Admitting: Physical Therapy

## 2017-06-11 ENCOUNTER — Encounter: Payer: Self-pay | Admitting: Physical Therapy

## 2017-06-11 DIAGNOSIS — M62838 Other muscle spasm: Secondary | ICD-10-CM | POA: Diagnosis not present

## 2017-06-11 DIAGNOSIS — M6281 Muscle weakness (generalized): Secondary | ICD-10-CM | POA: Diagnosis not present

## 2017-06-11 NOTE — Therapy (Signed)
[facility] [address] [place], [place], [address] Phone: [phone]   Fax:  [phone]  Physical Therapy Evaluation  Patient Details  Name: CALINDA STOCKINGER MRN: [id number] Date of Birth: January 23, 1971 Referring Provider: Dr. Lafe Garin   Encounter Date: 06/11/2017  PT End of Session - 06/11/17 1536    Visit Number  1    Date for PT Re-Evaluation  08/06/17    Authorization Type  UHC    PT Start Time  1015    PT Stop Time  1100    PT Time Calculation (min)  45 min    Activity Tolerance  Patient tolerated treatment well    Behavior During Therapy  [facility] for tasks assessed/performed       Past Medical History:  Diagnosis Date  . Achilles tendinitis   . Asthma   . Asthma    exercise induced  . Erythema nodosum    with COC's  . GERD (gastroesophageal reflux disease)   . Heart murmur   . Hiatal hernia   . Hypothyroidism   . Menorrhagia   . MVP (mitral valve prolapse)   . PONV (postoperative nausea and vomiting)   . Thyroid disease    hypothyroid    Past Surgical History:  Procedure Laterality Date  . APPENDECTOMY    . ENDOMETRIAL ABLATION W/ NOVASURE    . EYE SURGERY     [place]ix 2005 done  [place]  . STAPEDECTOMY Left 08/09/2013   Procedure: LEFT STAPEDECTOMY;  Surgeon: Serena Colonel, MD;  Location: [facility];  Service: ENT;  Laterality: Left;  Marland Kitchen VAGINAL HYSTERECTOMY  05/08/2017   added rectocele and cystocele repair, midurethral sling    There were no vitals filed for this visit.   Subjective Assessment - 06/11/17 1025    Subjective  Patient has vaginal hysterectomy with rectocele and cystocele repair, and midurethral sling on 05/08/2017. Next MD appointment on 06/25/2017 therefore no internal work till then. Patient had to self catherize for 11 days.  Patient had pressure for abdominal and anal pressure and urethral pain with urination.  Patient was not emptying her bladder correctly so  had to self cathrize herself.  Patient increased fiber intake and pressure has decreased.  I have a spasm feeling still.  Feels like I am riding a bike all the time and tightness. Patient has 2 bowel movments per morning. Bowels are loose but prior was solid with mush in the middle. Bowels were narrow. Urine stream is longer, slower, trickle. Sometimes has   to go back on the toilet to urinate just afterwards.     Patient Stated Goals  good vaginal health, reduce urinary leakage, sex without pain.     Currently in Pain?  Yes    Pain Score  1     Pain Location  Vagina    Pain Orientation  Mid    Pain Descriptors / Indicators  Pressure    Pain Type  Surgical pain    Pain Onset  More than a month ago    Pain Frequency  Constant    Aggravating Factors   more significant in the evening, prior to a bowel movement,    Pain Relieving Factors  lay on left side    Multiple Pain Sites  No         [facility] PT Assessment - 06/11/17 0001      Assessment   Medical Diagnosis  M62.838 Levator Spasm    Referring Provider  Dr. Lafe Garin    Onset Date/Surgical Date  05/08/17    Prior Therapy  None      Precautions   Precautions  Other (comment)    Precaution Comments  no internal work till 06/25/2017; No lifting more than 10#      Restrictions   Weight Bearing Restrictions  No      Balance Screen   Has the patient fallen in the past 6 months  No    Has the patient had a decrease in activity level because of a fear of falling?   No    Is the patient reluctant to leave their home because of a fear of falling?   No      Home Public house manager residence      Prior Function   Level of Independence  Independent    Vocation  Full time employment    Vocation Requirements  sitting    Leisure  elliptical with no impact, walking 30 minutes per day      Cognition   Overall Cognitive Status  Within Functional Limits for tasks assessed      Posture/Postural Control    Posture/Postural Control  No significant limitations      ROM / Strength   AROM / PROM / Strength  AROM;PROM;Strength      AROM   Lumbar Extension  decreased by 25%    Lumbar - Right Side Bend  decreased by 25%    Lumbar - Left Side Bend  decreased by 25%      Strength   Overall Strength Comments  abdominal strength 2/5    Right Hip External Rotation   4/5    Right Hip ABduction  3+/5    Left Hip External Rotation  4/5    Left Hip ABduction  3+/5    Right Knee Extension  4/5    Left Knee Extension  4/5      Palpation   Spinal mobility  L1-L5 decreased mobility    SI assessment   right ilium is rotated anteriorly    Palpation comment  tenderness located lower abdominal; tenderness located in bil. piriformis, bil. levator ani and obturator internist      Pelvic Dictraction   Findings  Positive    Side   Right    Comment  pain      Transfers   Transfers  Not assessed      Ambulation/Gait   Ambulation/Gait  No      Balance   Balance Assessed  -- bil. SLS with increased trunk sway                Objective measurements completed on examination: See above findings.    Pelvic Floor Special Questions - 06/11/17 0001    Prior Pregnancies  Yes    Number of Pregnancies  2    Number of Vaginal Deliveries  2    Currently Sexually Active  No due to restrictions from surgery    Urinary Leakage  No but has not challenged for SUI due to restrictions from surg    Urinary urgency  Yes    Urinary frequency  every hour during the day 2 -3 times per night    Fecal incontinence  No    Falling out feeling (prolapse)  No    Skin Integrity  Intact    Exam Type  Deferred due to MD wants none till 06/25/2017    Palpation  tenderness located  on bil. levator ani, ishiocavernosus, transverse perineum    Strength  Flicker               PT Education - 06/11/17 1536    Education provided  Yes    Education Details  pelvic floor contraction in [employer]) Educated   Patient    Methods  Explanation;Demonstration;Verbal cues;Handout    Comprehension  Returned demonstration;Verbalized understanding       PT Short Term Goals - 06/11/17 1551      PT SHORT TERM GOAL #1   Title  independent with initial HEP with LE stretches    Time  4    Period  Weeks    Status  New    Target Date  07/09/17      PT SHORT TERM GOAL #2   Title  understand how to toilet correctly to not strain the pelvic floor    Time  4    Period  Weeks    Status  New    Target Date  07/09/17      PT SHORT TERM GOAL #3   Title  able to brace abdominals and contract the pelvic floor correctly     Time  4    Period  Weeks    Status  New    Target Date  07/09/17      PT SHORT TERM GOAL #4   Title  understand how to lift correctly no more thatn 10# until orders are changed from MD    Time  4    Period  Weeks    Status  New    Target Date  07/09/17        PT Long Term Goals - 06/11/17 1554      PT LONG TERM GOAL #1   Title  independent with HEP and understand how to progress herself    Time  8    Period  Weeks    Status  New    Target Date  08/06/17      PT LONG TERM GOAL #2   Title  ability to walk 30 minutes per day without increased in pelvic floor pressure due to endurance of pelvic floor muscles    Time  8    Period  Weeks    Status  New    Target Date  08/06/17      PT LONG TERM GOAL #3   Title  urine stream is strong and does not have to double void due to improve pelvic floor strength    Time  8    Period  Weeks    Status  New    Target Date  08/06/17      PT LONG TERM GOAL #4   Title  at the end of work day pelvic pressure decreased >/= 75% due to improved pelvic floor and core strength    Time  8    Period  Weeks    Status  New    Target Date  08/06/17             Plan - 06/11/17 1538    Clinical Impression Statement  Patient is a 47 year old female s/p total vaginal hysterectomy, left salpingectomy, anterior and posterior colporrhaphy,  uterosacral ligament suspension and mid urethral sling on 05/08/2017. Patient reports her constant pressure pain is 1/10 and increases in the evening and prior to a bowel movement.  Lumbar extension and bilateral sidebending decreased by 25%.  Abdominal strength is 2/5.  Bilateral hip abduction is 3+/5 and rotation 4/5 and bilateral knee extension is 4/5.  L1-L5 mobility is decreased.  Right ilium is rotated anteriorly. Positive distraction test on the right ilium.  Patient has urinary  urgency and frequency.  Patient will go to the bathroom every hour. Patient does not have urinary leakage but she has not performed higher level activites that may cause leakage. Patient has not had intercourse due to MD orders.  Patient will benefit from skilled therapy to improve strength and improve function as she is recovering from her vaginal hysterectomy.      History and Personal Factors relevant to plan of care:  s/p total vaginal hysterectomy with cystocele and rectocele repair 05/08/2017; thyroid disease, endometrial ablation    Clinical Presentation  Evolving    Clinical Presentation due to:  No lifting >/= 10#; No internal work till 06/25/2017; not able to return to work and perform daily activities    Clinical Decision Making  Moderate    Rehab Potential  Excellent    Clinical Impairments Affecting Rehab Potential  No lifting >/= 10#; No internal work till 06/25/2017;    PT Frequency  2x / week    PT Duration  8 weeks    PT Treatment/Interventions  Biofeedback;Cryotherapy;Electrical Stimulation;Moist Heat;Ultrasound;Therapeutic exercise;Therapeutic activities;Neuromuscular re-education;Patient/family education;Manual techniques;Dry needling    PT Next Visit Plan  stretches to hip, thigh, hamstring, ITB; pelvic floor exercise; abdominal massage; toileting; abdominal bracing    PT Home Exercise Plan  toileting; abdominal massage    Consulted and Agree with Plan of Care  Patient       Patient will benefit from  skilled therapeutic intervention in order to improve the following deficits and impairments:  Increased fascial restricitons, Pain, Decreased coordination, Increased muscle spasms, Decreased strength, Decreased range of motion, Decreased activity tolerance  Visit Diagnosis: Muscle weakness (generalized) - Plan: PT plan of care cert/re-cert  Other muscle spasm - Plan: PT plan of care cert/re-cert     Problem List Patient Active Problem List   Diagnosis Date Noted  . Otosclerosis 08/09/2013    Eulis Fosterheryl Maxen Rowland, PT 06/11/17 4:06 PM   [facility] [address] [place], [place], [address] Phone: [phone]   Fax:  [phone]  Name: Maryelizabeth RowanJudy L Speyer MRN: [id number] Date of Birth: [birth year]

## 2017-06-11 NOTE — Patient Instructions (Addendum)
Quick Contraction: Gravity Eliminated (Hook-Lying)    Lie with hips and knees bent. Quickly squeeze then fully relax pelvic floor. Perform _1__ sets of _5__. Rest for _1__ seconds between sets. Do _5__ times a day.   Copyright  VHI. All rights reserved.    Slow Contraction: Gravity Eliminated (Hook-Lying)    Lie with hips and knees bent. Slowly squeeze pelvic floor for _5__ seconds. Rest for __5_ seconds. Repeat _5__ times. Do _5__ times a day.   Copyright  VHI. All rights reserved.  [facility] [address], [address] [place], [place] [address] Phone # [phone] Fax [phone]

## 2017-06-16 DIAGNOSIS — K219 Gastro-esophageal reflux disease without esophagitis: Secondary | ICD-10-CM | POA: Insufficient documentation

## 2017-06-16 DIAGNOSIS — Z87891 Personal history of nicotine dependence: Secondary | ICD-10-CM | POA: Diagnosis not present

## 2017-06-16 DIAGNOSIS — Z7289 Other problems related to lifestyle: Secondary | ICD-10-CM | POA: Diagnosis not present

## 2017-06-17 ENCOUNTER — Encounter: Payer: Self-pay | Admitting: Physical Therapy

## 2017-06-17 ENCOUNTER — Ambulatory Visit: Payer: Commercial Managed Care - PPO | Admitting: Physical Therapy

## 2017-06-17 DIAGNOSIS — M6281 Muscle weakness (generalized): Secondary | ICD-10-CM | POA: Diagnosis not present

## 2017-06-17 DIAGNOSIS — M62838 Other muscle spasm: Secondary | ICD-10-CM | POA: Diagnosis not present

## 2017-06-17 NOTE — Therapy (Signed)
[facility] [address] [place], [place], [address] Phone: [phone]   Fax:  [phone]  Physical Therapy Treatment  Patient Details  Name: Sonya Snyder MRN: [id number] Date of Birth: 17-Dec-1970 Referring Provider: Dr. Blima Rich   Encounter Date: 06/17/2017  PT End of Session - 06/17/17 0929    Visit Number  2    Date for PT Re-Evaluation  08/06/17    Authorization Type  UHC    PT Start Time  0845    PT Stop Time  0928    PT Time Calculation (min)  43 min    Activity Tolerance  Patient tolerated treatment well    Behavior During Therapy  [facility] for tasks assessed/performed       Past Medical History:  Diagnosis Date  . Achilles tendinitis   . Asthma   . Asthma    exercise induced  . Erythema nodosum    with COC's  . GERD (gastroesophageal reflux disease)   . Heart murmur   . Hiatal hernia   . Hypothyroidism   . Menorrhagia   . MVP (mitral valve prolapse)   . PONV (postoperative nausea and vomiting)   . Thyroid disease    hypothyroid    Past Surgical History:  Procedure Laterality Date  . APPENDECTOMY    . ENDOMETRIAL ABLATION W/ NOVASURE    . EYE SURGERY     Lasix 2005 done  [place]  . STAPEDECTOMY Left 08/09/2013   Procedure: LEFT STAPEDECTOMY;  Surgeon: Izora Gala, MD;  Location: [place];  Service: ENT;  Laterality: Left;  Marland Kitchen VAGINAL HYSTERECTOMY  05/08/2017   added rectocele and cystocele repair, midurethral sling    There were no vitals filed for this visit.  Subjective Assessment - 06/17/17 0848    Subjective  I have been doing the pelvic floor exericses.  I have not noticed a difference yet. I have not had urinary leakage with my reflex cough.     Patient Stated Goals  good vaginal health, reduce urinary leakage, sex without pain.     Currently in Pain?  Yes    Pain Score  1     Pain Location  Vagina    Pain Orientation  Mid    Pain Descriptors / Indicators   Pressure    Pain Type  Surgical pain    Pain Onset  More than a month ago    Pain Frequency  Constant    Aggravating Factors   more significant in the evening, prior to bowel movement    Pain Relieving Factors  ay on left side    Multiple Pain Sites  No                       [facility] Adult PT Treatment/Exercise - 06/17/17 0001      Manual Therapy   Manual Therapy  Soft tissue mobilization;Myofascial release    Soft tissue mobilization  circular massage to improve peristasis of the intetestines    Myofascial Release  upper abdoment to release the 3 planes of fascis             PT Education - 06/17/17 0929    Education provided  Yes    Education Details  toileting, abdominal massage; stretche    Person(s) Educated  Patient    Methods  Explanation;Demonstration;Verbal cues;Handout    Comprehension  Returned demonstration;Verbalized understanding       PT Short Term Goals -  06/11/17 1551      PT SHORT TERM GOAL #1   Title  independent with initial HEP with LE stretches    Time  4    Period  Weeks    Status  New    Target Date  07/09/17      PT SHORT TERM GOAL #2   Title  understand how to toilet correctly to not strain the pelvic floor    Time  4    Period  Weeks    Status  New    Target Date  07/09/17      PT SHORT TERM GOAL #3   Title  able to brace abdominals and contract the pelvic floor correctly     Time  4    Period  Weeks    Status  New    Target Date  07/09/17      PT SHORT TERM GOAL #4   Title  understand how to lift correctly no more thatn 10# until orders are changed from MD    Time  4    Period  Weeks    Status  New    Target Date  07/09/17        PT Long Term Goals - 06/11/17 1554      PT LONG TERM GOAL #1   Title  independent with HEP and understand how to progress herself    Time  8    Period  Weeks    Status  New    Target Date  08/06/17      PT LONG TERM GOAL #2   Title  ability to walk 30 minutes per day without  increased in pelvic floor pressure due to endurance of pelvic floor muscles    Time  8    Period  Weeks    Status  New    Target Date  08/06/17      PT LONG TERM GOAL #3   Title  urine stream is strong and does not have to double void due to improve pelvic floor strength    Time  8    Period  Weeks    Status  New    Target Date  08/06/17      PT LONG TERM GOAL #4   Title  at the end of work day pelvic pressure decreased >/= 75% due to improved pelvic floor and core strength    Time  8    Period  Weeks    Status  New    Target Date  08/06/17            Plan - 06/17/17 0930    Clinical Impression Statement  Patient just started therapy so she has not met any goals.  Patient had increased tissue mobility in abdomen after soft tissue work.  Patient is able to contract her abdominals correct with bracing.  Patient is able to do her stretches.  Patient will benefit from skilled therapy to imporve strength and improve function as she is recovering from her vaginal hysterectomy.     Rehab Potential  Excellent    Clinical Impairments Affecting Rehab Potential  No lifting >/= 10#; No internal work till 06/25/2017;    PT Frequency  2x / week    PT Duration  8 weeks    PT Treatment/Interventions  Biofeedback;Cryotherapy;Electrical Stimulation;Moist Heat;Ultrasound;Therapeutic exercise;Therapeutic activities;Neuromuscular re-education;Patient/family education;Manual techniques;Dry needling    PT Next Visit Plan  stretches to hip, hamstring, ITB; pelvic floor exercise;  abdominal bracing  with hip rotation    PT Home Exercise Plan  progress as needed    Recommended Other Services  MD signed initial eval    Consulted and Agree with Plan of Care  Patient       Patient will benefit from skilled therapeutic intervention in order to improve the following deficits and impairments:  Increased fascial restricitons, Pain, Decreased coordination, Increased muscle spasms, Decreased strength, Decreased  range of motion, Decreased activity tolerance  Visit Diagnosis: Muscle weakness (generalized)  Other muscle spasm     Problem List Patient Active Problem List   Diagnosis Date Noted  . Otosclerosis 08/09/2013    Earlie Counts, PT 06/17/17 9:33 AM   [facility] [address], [place], [place], [address] Phone: [phone]   Fax:  [phone]  Name: Sonya Snyder MRN: [id number] Date of Birth: 03-31-1970

## 2017-06-17 NOTE — Patient Instructions (Addendum)
Toileting Techniques for Bowel Movements (Defecation) Using your belly (abdomen) and pelvic floor muscles to have a bowel movement is usually instinctive.  Sometimes people can have problems with these muscles and have to relearn proper defecation (emptying) techniques.  If you have weakness in your muscles, organs that are falling out, decreased sensation in your pelvis, or ignore your urge to go, you may find yourself straining to have a bowel movement.  You are straining if you are: . holding your breath or taking in a huge gulp of air and holding it  . keeping your lips and jaw tensed and closed tightly . turning red in the face because of excessive pushing or forcing . developing or worsening your  hemorrhoids . getting faint while pushing . not emptying completely and have to defecate many times a day  If you are straining, you are actually making it harder for yourself to have a bowel movement.  Many people find they are pulling up with the pelvic floor muscles and closing off instead of opening the anus. Due to lack pelvic floor relaxation and coordination the abdominal muscles, one has to work harder to push the feces out.  Many people have never been taught how to defecate efficiently and effectively.  Notice what happens to your body when you are having a bowel movement.  While you are sitting on the toilet pay attention to the following areas: . Jaw and mouth position . Angle of your hips   . Whether your feet touch the ground or not . Arm placement  . Spine position . Waist . Belly tension . Anus (opening of the anal canal)  An Evacuation/Defecation Plan   Here are the 4 basic points:  1. Lean forward enough for your elbows to rest on your knees 2. Support your feet on the floor or use a low stool if your feet don't touch the floor  3. Push out your belly as if you have swallowed a beach ball-you should feel a widening of your waist 4. Open and relax your pelvic floor muscles,  rather than tightening around the anus      The following conditions my require modifications to your toileting posture:  . If you have had surgery in the past that limits your back, hip, pelvic, knee or ankle flexibility . Constipation   Your healthcare practitioner may make the following additional suggestions and adjustments:  1) Sit on the toilet  a) Make sure your feet are supported. b) Notice your hip angle and spine position-most people find it effective to lean forward or raise their knees, which can help the muscles around the anus to relax  c) When you lean forward, place your forearms on your thighs for support  2) Relax suggestions a) Breath deeply in through your nose and out slowly through your mouth as if you are smelling the flowers and blowing out the candles. b) To become aware of how to relax your muscles, contracting and releasing muscles can be helpful.  Pull your pelvic floor muscles in tightly by using the image of holding back gas, or closing around the anus (visualize making a circle smaller) and lifting the anus up and in.  Then release the muscles and your anus should drop down and feel open. Repeat 5 times ending with the feeling of relaxation. c) Keep your pelvic floor muscles relaxed; let your belly bulge out. d) The digestive tract starts at the mouth and ends at the anal opening, so be   sure to relax both ends of the tube.  Place your tongue on the roof of your mouth with your teeth separated.  This helps relax your mouth and will help to relax the anus at the same time.  3) Empty (defecation) a) Keep your pelvic floor and sphincter relaxed, then bulge your anal muscles.  Make the anal opening wide.  b) Stick your belly out as if you have swallowed a beach ball. c) Make your belly wall hard using your belly muscles while continuing to breathe. Doing this makes it easier to open your anus. d) Breath out and give a grunt (or try using other sounds such as  ahhhh, shhhhh, ohhhh or grrrrrrr).  4) Finish a) As you finish your bowel movement, pull the pelvic floor muscles up and in.  This will leave your anus in the proper place rather than remaining pushed out and down. If you leave your anus pushed out and down, it will start to feel as though that is normal and give you incorrect signals about needing to have a bowel movement.    About Abdominal Massage  Abdominal massage, also called external colon massage, is a self-treatment circular massage technique that can reduce and eliminate gas and ease constipation. The colon naturally contracts in waves in a clockwise direction starting from inside the right hip, moving up toward the ribs, across the belly, and down inside the left hip.  When you perform circular abdominal massage, you help stimulate your colon's normal wave pattern of movement called peristalsis.  It is most beneficial when done after eating.  Positioning You can practice abdominal massage with oil while lying down, or in the shower with soap.  Some people find that it is just as effective to do the massage through clothing while sitting or standing.  How to Massage Start by placing your finger tips or knuckles on your right side, just inside your hip bone.  . Make small circular movements while you move upward toward your rib cage.   . Once you reach the bottom right side of your rib cage, take your circular movements across to the left side of the bottom of your rib cage.  . Next, move downward until you reach the inside of your left hip bone.  This is the path your feces travel in your colon. . Continue to perform your abdominal massage in this pattern for 10 minutes each day.     You can apply as much pressure as is comfortable in your massage.  Start gently and build pressure as you continue to practice.  Notice any areas of pain as you massage; areas of slight pain may be relieved as you massage, but if you have areas of  significant or intense pain, consult with your healthcare provider.  Other Considerations . General physical activity including bending and stretching can have a beneficial massage-like effect on the colon.  Deep breathing can also stimulate the colon because breathing deeply activates the same nervous system that supplies the colon.   . Abdominal massage should always be used in combination with a bowel-conscious diet that is high in the proper type of fiber for you, fluids (primarily water), and a regular exercise program. Isometric Hold (Hook-Lying)    Lie with hips and knees bent. Slowly inhale, and then exhale. Pull navel toward spine and Hold for _5__ seconds. Continue to breathe in and out during hold. Rest for _5__ seconds. Repeat __10_ times. Do __2_ times a day.  Copyright  VHI. All rights reserved.  Piriformis Stretch, Supine    Lie supine, one ankle crossed onto opposite knee. Holding bottom leg behind knee, gently pull legs toward chest until stretch is felt in buttock of top leg. Hold _30__ seconds. For deeper stretch gently push top knee away from body.  Repeat __2_ times per session. Do _1__ sessions per day.  Copyright  VHI. All rights reserved.  Lower Trunk Rotation Stretch    Keeping back flat and feet together, rotate knees to left side. Hold _30___ seconds. Repeat __2__ times per set. Do ___1_ sets per session. Do ___1_ sessions per day.  http://orth.exer.us/123   Copyright  VHI. All rights reserved.  Quads / HF, Side-Lying    Lie on one side, legs bent. Hold foot of top leg with same-side hand. Raise leg. Hold _30__ seconds.  Repeat _2__ times per session. Do __1_ sessions per day.  Copyright  VHI. All rights reserved.  [facility] [address], [address] [place], [place] [address] Phone # [phone] Fax [phone]

## 2017-06-25 DIAGNOSIS — Z9889 Other specified postprocedural states: Secondary | ICD-10-CM | POA: Diagnosis not present

## 2017-06-26 ENCOUNTER — Encounter: Payer: Self-pay | Admitting: Physical Therapy

## 2017-06-26 ENCOUNTER — Ambulatory Visit: Payer: Commercial Managed Care - PPO | Admitting: Physical Therapy

## 2017-06-26 DIAGNOSIS — M62838 Other muscle spasm: Secondary | ICD-10-CM

## 2017-06-26 DIAGNOSIS — M6281 Muscle weakness (generalized): Secondary | ICD-10-CM

## 2017-06-26 NOTE — Patient Instructions (Signed)
Access Code: [email or web address]  URL: [email or web address]  Date: 06/26/2017  Prepared by: Eulis Fosterheryl Sandra Brents   Exercises Mini Squat - 10 reps - 1 sets - 1 hold - 1x daily - 7x weekly Mini Lunge - 10 reps - 1 sets - 1x daily - 7x weekly Quadruped Exhale with Pelvic Floor Contraction and Arm Raise - 10 reps - 1 sets - 1x daily - 7x weekly Quadruped Leg Lifts - 10 reps - 1 sets - 1x daily - 7x weekly Quadruped Pelvic Floor Contraction with Opposite Arm and Leg Lift - 10 reps - 1 sets - 1x daily - 7x weekly Bridge - 15 reps - 1 sets - 1x daily - 7x weekly Supine Transversus Abdominis Bracing with Double Leg Fallout - 10 reps - 2 sets - 1x daily - 7x weekly  [facility] [address], [address] [place], [place] [address] Phone # [phone] Fax [phone]

## 2017-06-26 NOTE — Therapy (Signed)
[facility] [address], [address] [place], [place], [address] Phone: [phone]   Fax:  [phone]  Physical Therapy Treatment  Patient Details  Name: Sonya Snyder MRN: [id number] Date of Birth: [birth year] Referring Provider: Dr. Lafe GarinAlison Weidner   Encounter Date: 06/26/2017  PT End of Session - 06/26/17 0857    Visit Number  3    Authorization Type  UHC    PT Start Time  0850    PT Stop Time  0930    PT Time Calculation (min)  40 min    Activity Tolerance  Patient tolerated treatment well    Behavior During Therapy  [facility] for tasks assessed/performed       Past Medical History:  Diagnosis Date  . Achilles tendinitis   . Asthma   . Asthma    exercise induced  . Erythema nodosum    with COC's  . GERD (gastroesophageal reflux disease)   . Heart murmur   . Hiatal hernia   . Hypothyroidism   . Menorrhagia   . MVP (mitral valve prolapse)   . PONV (postoperative nausea and vomiting)   . Thyroid disease    hypothyroid    Past Surgical History:  Procedure Laterality Date  . APPENDECTOMY    . ENDOMETRIAL ABLATION W/ NOVASURE    . EYE SURGERY     [place] 2005 [place]  [place]  . STAPEDECTOMY Left 08/09/2013   Procedure: LEFT STAPEDECTOMY;  Surgeon: Serena ColonelJefry Rosen, MD;  Location: [facility];  Service: ENT;  Laterality: Left;  Marland Kitchen. VAGINAL HYSTERECTOMY  05/08/2017   added rectocele and cystocele repair, midurethral sling    There were no vitals filed for this visit.  Subjective Assessment - 06/26/17 0853    Subjective  I start work on Monday. I was cleared from the doctor.  MD said everything looks good.  I still have that lower abdominal pressure.  I walked and felt heavy in the perineal area. I have not had any leakage. I feel like I can kegel stronger on the right than left.     Patient Stated Goals  good vaginal health, reduce urinary leakage, sex without pain.     Currently in Pain?  Yes    Pain Score  1      Pain Location  Vagina    Pain Orientation  Lower left worse than right    Pain Descriptors / Indicators  Pressure    Pain Type  Surgical pain    Pain Onset  More than a month ago    Pain Frequency  Constant    Aggravating Factors   more significant in the evening, prior to bowel movement    Pain Relieving Factors  lay on left side    Multiple Pain Sites  No                       [facility] Adult PT Treatment/Exercise - 06/26/17 0001      Exercises   Exercises  Lumbar      Lumbar Exercises: Aerobic   Elliptical  4 min level 1 to facilitate proform    Nustep  level 3 for 5 min arms and legs             PT Education - 06/26/17 0923    Education provided  Yes    Education Details  Access Code: [email or web address]; core strength, squat and lunges    Person(s) Educated  Patient  Methods  Explanation;Demonstration;Verbal cues;Handout    Comprehension  Returned demonstration;Verbalized understanding       PT Short Term Goals - 06/26/17 0930      PT SHORT TERM GOAL #1   Title  independent with initial HEP with LE stretches    Time  4    Period  Weeks    Status  Achieved      PT SHORT TERM GOAL #2   Title  understand how to toilet correctly to not strain the pelvic floor    Time  4    Period  Weeks    Status  Achieved      PT SHORT TERM GOAL #3   Title  able to brace abdominals and contract the pelvic floor correctly     Time  4    Period  Weeks    Status  Achieved      PT SHORT TERM GOAL #4   Title  understand how to lift correctly no more thatn 10# until orders are changed from MD    Time  4    Period  Weeks    Status  Achieved        PT Long Term Goals - 06/11/17 1554      PT LONG TERM GOAL #1   Title  independent with HEP and understand how to progress herself    Time  8    Period  Weeks    Status  New    Target Date  08/06/17      PT LONG TERM GOAL #2   Title  ability to walk 30 minutes per day without increased in pelvic floor pressure due  to endurance of pelvic floor muscles    Time  8    Period  Weeks    Status  New    Target Date  08/06/17      PT LONG TERM GOAL #3   Title  urine stream is strong and does not have to double void due to improve pelvic floor strength    Time  8    Period  Weeks    Status  New    Target Date  08/06/17      PT LONG TERM GOAL #4   Title  at the end of work day pelvic pressure decreased >/= 75% due to improved pelvic floor and core strength    Time  8    Period  Weeks    Status  New    Target Date  08/06/17            Plan - 06/26/17 1610    Clinical Impression Statement  Patient is now able to lift without restrictions and reports she will progress slowly.  Patient has decreased trunk control when she squat or lunges psat 45 degrees due to trunk weakness. Patient reports she is not leaking urine.  Patient reports she is able to have a bowel movement without straining and using proper technique.  Patient was able to do the nustep and elliptical without increased pressure so she is able to do her proform at home.  Patient  feels she contracts the right side of the pelvic floor better than the left side.  Patient will benefi tfrom skilled therapy to improve strength and improve function as she is recovering from her vaginal hysterectomy.     Rehab Potential  Excellent    Clinical Impairments Affecting Rehab Potential  No lifting >/= 10#; No internal work till 06/25/2017;  PT Frequency  2x / week    PT Duration  8 weeks    PT Treatment/Interventions  Biofeedback;Cryotherapy;Electrical Stimulation;Moist Heat;Ultrasound;Therapeutic exercise;Therapeutic activities;Neuromuscular re-education;Patient/family education;Manual techniques;Dry needling    PT Next Visit Plan  progress lifting, work equal pelvic floor contraction; progress exercises that she can do at her desk ie sitting and standing    PT Home Exercise Plan  Access Code: [email or web address]    Consulted and Agree with Plan of Care  Patient        Patient will benefit from skilled therapeutic intervention in order to improve the following deficits and impairments:  Increased fascial restricitons, Pain, Decreased coordination, Increased muscle spasms, Decreased strength, Decreased range of motion, Decreased activity tolerance  Visit Diagnosis: Muscle weakness (generalized)  Other muscle spasm     Problem List Patient Active Problem List   Diagnosis Date Noted  . Otosclerosis 08/09/2013    Eulis Foster, PT 06/26/17 9:31 AM   [facility] [address], [address] [place], [place], [address] Phone: [phone]   Fax:  [phone]  Name: Sonya Snyder MRN: [id number] Date of Birth: [birth year]

## 2017-06-30 ENCOUNTER — Ambulatory Visit: Payer: Commercial Managed Care - PPO | Admitting: Physical Therapy

## 2017-06-30 ENCOUNTER — Encounter: Payer: Self-pay | Admitting: Physical Therapy

## 2017-06-30 DIAGNOSIS — M6281 Muscle weakness (generalized): Secondary | ICD-10-CM

## 2017-06-30 DIAGNOSIS — M62838 Other muscle spasm: Secondary | ICD-10-CM

## 2017-06-30 NOTE — Patient Instructions (Signed)
Relaxation Exercises with the Urge to Void   When you experience an urge to void:  FIRST  Stop and stand very still    Sit down if you can    Don't move    You need to stay very still to maintain control  SECOND Squeeze your pelvic floor muscles 5 times, like a quick flick, to keep from leaking  THIRD Relax  Take a deep breath and then let it out  Try to make the urge go away by using relaxation and visualization techniques  FINALLY When you feel the urge go away somewhat, walk normally to the bathroom.   If the urge gets suddenly stronger on the way, you may stop again and relax to regain control. Lay on back with legs on wall and so diaphragmatic breathing for 5 minutes daily  When sit on commode do belly breathing and rock pelvis back and forth.   Hold urine every 1 hour.   [facility] [address] [place], [place] [address] Phone # [phone] Fax [phone]

## 2017-06-30 NOTE — Therapy (Signed)
[facility] [address], [address] [place], [place], [address] Phone: [phone]   Fax:  [phone]  Physical Therapy Treatment  Patient Details  Name: Sonya Snyder MRN: [id number] Date of Birth: 1970-12-10 Referring Provider: Dr. Lafe Garin   Encounter Date: 06/30/2017  PT End of Session - 06/30/17 1318    Visit Number  4    Date for PT Re-Evaluation  08/06/17    Authorization Type  UHC    PT Start Time  1230    PT Stop Time  1310    PT Time Calculation (min)  40 min    Activity Tolerance  Patient tolerated treatment well    Behavior During Therapy  [facility] for tasks assessed/performed       Past Medical History:  Diagnosis Date  . Achilles tendinitis   . Asthma   . Asthma    exercise induced  . Erythema nodosum    with COC's  . GERD (gastroesophageal reflux disease)   . Heart murmur   . Hiatal hernia   . Hypothyroidism   . Menorrhagia   . MVP (mitral valve prolapse)   . PONV (postoperative nausea and vomiting)   . Thyroid disease    hypothyroid    Past Surgical History:  Procedure Laterality Date  . APPENDECTOMY    . ENDOMETRIAL ABLATION W/ NOVASURE    . EYE SURGERY     Lasix 2005 done  [place]  . STAPEDECTOMY Left 08/09/2013   Procedure: LEFT STAPEDECTOMY;  Surgeon: Serena Colonel, MD;  Location: [facility];  Service: ENT;  Laterality: Left;  Marland Kitchen VAGINAL HYSTERECTOMY  05/08/2017   added rectocele and cystocele repair, midurethral sling    There were no vitals filed for this visit.  Subjective Assessment - 06/30/17 1242    Subjective  I am feeling good.  Yesterday was my first day at work.  I had a hard time to empty my bladder. Frequent urination. I had trouble to relax to empty my bladder.  Go every 45 minutes to urinate.     Patient Stated Goals  good vaginal health, reduce urinary leakage, sex without pain.     Currently in Pain?  No/denies    Multiple Pain Sites  No                     Pelvic Floor Special Questions - 06/30/17 0001    External Palpation  tenderness located on bil. ishiocavernosus, bulbocavernosus, and superficial transverse perineum    Prolapse  None    Pelvic Floor Internal Exam  Patient confirmed identification and approves PT to treat and assess the pelvic floor    Exam Type  Vaginal    Palpation  tenderness located throughout pelvic floor muscles    Strength  good squeeze, good lift, able to hold agaisnt strong resistance    Tone  increase        [facility] Adult PT Treatment/Exercise - 06/30/17 0001      Self-Care   Self-Care  Other Self-Care Comments    Other Self-Care Comments   instructions on urge to void to increase urination every 1 hour      Lumbar Exercises: Aerobic   Nustep  level 3 for 6 min arms and legs      Manual Therapy   Manual Therapy  Myofascial release;Internal Pelvic Floor    Myofascial Release  gently going through the 3 planes of fascia while gripping the perinuem with fingers inside  the pubic rami    Internal Pelvic Floor  gently myofascial releas of the urethra spincter bil. Soft tissue work to bil. Advice worker, and ishciocavernosus and bulbocavernosus             PT Education - 06/30/17 1311    Education provided  Yes    Education Details  urge to void, pelvic relaxation techniques, toileting techniques    Person(s) Educated  Patient    Methods  Explanation;Demonstration;Verbal cues;Handout    Comprehension  Returned demonstration;Verbalized understanding       PT Short Term Goals - 06/26/17 0930      PT SHORT TERM GOAL #1   Title  independent with initial HEP with LE stretches    Time  4    Period  Weeks    Status  Achieved      PT SHORT TERM GOAL #2   Title  understand how to toilet correctly to not strain the pelvic floor    Time  4    Period  Weeks    Status  Achieved      PT SHORT TERM GOAL #3   Title  able to brace abdominals and contract  the pelvic floor correctly     Time  4    Period  Weeks    Status  Achieved      PT SHORT TERM GOAL #4   Title  understand how to lift correctly no more thatn 10# until orders are changed from MD    Time  4    Period  Weeks    Status  Achieved        PT Long Term Goals - 06/11/17 1554      PT LONG TERM GOAL #1   Title  independent with HEP and understand how to progress herself    Time  8    Period  Weeks    Status  New    Target Date  08/06/17      PT LONG TERM GOAL #2   Title  ability to walk 30 minutes per day without increased in pelvic floor pressure due to endurance of pelvic floor muscles    Time  8    Period  Weeks    Status  New    Target Date  08/06/17      PT LONG TERM GOAL #3   Title  urine stream is strong and does not have to double void due to improve pelvic floor strength    Time  8    Period  Weeks    Status  New    Target Date  08/06/17      PT LONG TERM GOAL #4   Title  at the end of work day pelvic pressure decreased >/= 75% due to improved pelvic floor and core strength    Time  8    Period  Weeks    Status  New    Target Date  08/06/17            Plan - 06/30/17 1245    Clinical Impression Statement  Patient was having difficulty with urination due to increased tone of the pelvic floor muscles. Patient was instructed on how to relax when urinating.  Patient will go to the bathroom every 45 minutes so she is  going to try to wait every hour. Patient has increased tendenress located in the obturator internist and levator ani. Pelvic floor strength is 4/5 but  will  fatique.  Patient will benefit from skilled therapy to improve strength and improve function as she is recovering from her vaginal hysteretomy.     Rehab Potential  Excellent    Clinical Impairments Affecting Rehab Potential  No lifting >/= 10#; No internal work till 06/25/2017;    PT Frequency  2x / week    PT Duration  8 weeks    PT Treatment/Interventions   Biofeedback;Cryotherapy;Electrical Stimulation;Moist Heat;Ultrasound;Therapeutic exercise;Therapeutic activities;Neuromuscular re-education;Patient/family education;Manual techniques;Dry needling    PT Next Visit Plan  continue with soft tissue work, educate on self perineal massage, progress time to urinate    Consulted and Agree with Plan of Care  Patient       Patient will benefit from skilled therapeutic intervention in order to improve the following deficits and impairments:  Increased fascial restricitons, Pain, Decreased coordination, Increased muscle spasms, Decreased strength, Decreased range of motion, Decreased activity tolerance  Visit Diagnosis: Muscle weakness (generalized)  Other muscle spasm     Problem List Patient Active Problem List   Diagnosis Date Noted  . Otosclerosis 08/09/2013    Eulis Foster, PT 06/30/17 1:22 PM   [facility] [address], [address] [place], [place], [address] Phone: [phone]   Fax:  [phone]  Name: Sonya Snyder MRN: [id number] Date of Birth: Oct 01, 1970

## 2017-07-08 ENCOUNTER — Ambulatory Visit: Payer: Commercial Managed Care - PPO | Attending: Obstetrics and Gynecology | Admitting: Physical Therapy

## 2017-07-08 ENCOUNTER — Encounter: Payer: Self-pay | Admitting: Physical Therapy

## 2017-07-08 DIAGNOSIS — M6281 Muscle weakness (generalized): Secondary | ICD-10-CM | POA: Insufficient documentation

## 2017-07-08 DIAGNOSIS — M62838 Other muscle spasm: Secondary | ICD-10-CM | POA: Diagnosis not present

## 2017-07-08 NOTE — Therapy (Signed)
[facility] [address], [address] [place], [place], [address] Phone: [phone]   Fax:  [phone]  Physical Therapy Treatment  Patient Details  Name: Sonya Snyder MRN: [id number] Date of Birth: [birth year] Referring Provider: Dr. Lafe Garin   Encounter Date: 07/08/2017  PT End of Session - 07/08/17 1236    Visit Number  5    Date for PT Re-Evaluation  08/06/17    Authorization Type  [employer]    PT Start Time  1230    PT Stop Time  1310    PT Time Calculation (min)  40 min    Activity Tolerance  Patient tolerated treatment well    Behavior During Therapy  [facility] for tasks assessed/performed       Past Medical History:  Diagnosis Date  . Achilles tendinitis   . Asthma   . Asthma    exercise induced  . Erythema nodosum    with COC's  . GERD (gastroesophageal reflux disease)   . Heart murmur   . Hiatal hernia   . Hypothyroidism   . Menorrhagia   . MVP (mitral valve prolapse)   . PONV (postoperative nausea and vomiting)   . Thyroid disease    hypothyroid    Past Surgical History:  Procedure Laterality Date  . APPENDECTOMY    . ENDOMETRIAL ABLATION W/ NOVASURE    . EYE SURGERY     Lasix 2005 done  [place]  . STAPEDECTOMY Left 08/09/2013   Procedure: LEFT STAPEDECTOMY;  Surgeon: Serena Colonel, MD;  Location: [facility];  Service: ENT;  Laterality: Left;  Marland Kitchen VAGINAL HYSTERECTOMY  05/08/2017   added rectocele and cystocele repair, midurethral sling    There were no vitals filed for this visit.  Subjective Assessment - 07/08/17 1235    Subjective  I feel stiff today.  I get up early to exercise.  Getting easier to empty her bladder. Can wait longer than 45 minutes now.     Patient Stated Goals  good vaginal health, reduce urinary leakage, sex without pain.     Currently in Pain?  No/denies                    Pelvic Floor Special Questions - 07/08/17 0001    Pelvic Floor Internal Exam   Patient confirmed identification and approves PT to treat and assess the pelvic floor    Exam Type  Vaginal    Palpation  tenderness located throughout pelvic floor muscles    Strength  good squeeze, good lift, able to hold agaisnt strong resistance        [facility] Adult PT Treatment/Exercise - 07/08/17 0001      Lumbar Exercises: Stretches   Prone Mid Back Stretch  3 reps;30 seconds    Other Lumbar Stretch Exercise  quadruped thread the needle bil.       Lumbar Exercises: Aerobic   Nustep  level 3 for 6 min arms and legs      Lumbar Exercises: Quadruped   Madcat/Old Horse  10 reps    Opposite Arm/Leg Raise  Left arm/Right leg;Right arm/Left leg;10 reps;1 second tactile cues for core engage muscles      Manual Therapy   Manual Therapy  Internal Pelvic Floor    Internal Pelvic Floor  gently myofascial releas of the urethra spincter bil. Soft tissue work to bil. Advice worker, and ishciocavernosus and bulbocavernosus  PT Education - 07/08/17 1315    Education provided  Yes    Education Details  Access Code: [email or web address]     Person(s) Educated  Patient    Methods  Explanation;Demonstration;Verbal cues;Handout    Comprehension  Verbalized understanding;Returned demonstration       PT Short Term Goals - 06/26/17 0930      PT SHORT TERM GOAL #1   Title  independent with initial HEP with LE stretches    Time  4    Period  Weeks    Status  Achieved      PT SHORT TERM GOAL #2   Title  understand how to toilet correctly to not strain the pelvic floor    Time  4    Period  Weeks    Status  Achieved      PT SHORT TERM GOAL #3   Title  able to brace abdominals and contract the pelvic floor correctly     Time  4    Period  Weeks    Status  Achieved      PT SHORT TERM GOAL #4   Title  understand how to lift correctly no more thatn 10# until orders are changed from MD    Time  4    Period  Weeks    Status  Achieved        PT Long Term  Goals - 07/08/17 1319      PT LONG TERM GOAL #1   Title  independent with HEP and understand how to progress herself    Time  8    Period  Weeks    Status  On-going      PT LONG TERM GOAL #2   Title  ability to walk 30 minutes per day without increased in pelvic floor pressure due to endurance of pelvic floor muscles    Time  8    Period  Weeks    Status  On-going      PT LONG TERM GOAL #3   Title  urine stream is strong and does not have to double void due to improve pelvic floor strength    Time  8    Period  Weeks    Status  On-going      PT LONG TERM GOAL #4   Title  at the end of work day pelvic pressure decreased >/= 75% due to improved pelvic floor and core strength    Time  8    Period  Weeks    Status  On-going            Plan - 07/08/17 1238    Clinical Impression Statement  Patient is able to hold urine 45  minutes longer.  Patient has less tenderness in the pelvic floor muscles.  Patient is learning stretches for her back and pelvic floor.  Patient would like to learn yoga exercises for her core.  Patient will benefit from skilled therapy to improve strength and improve function as she is recovering from her vaginal hysterectomy.     Rehab Potential  Excellent    Clinical Impairments Affecting Rehab Potential  No lifting >/= 10#; No internal work till 06/25/2017;    PT Frequency  2x / week    PT Duration  8 weeks    PT Treatment/Interventions  Biofeedback;Cryotherapy;Electrical Stimulation;Moist Heat;Ultrasound;Therapeutic exercise;Therapeutic activities;Neuromuscular re-education;Patient/family education;Manual techniques;Dry needling    PT Next Visit Plan  continue with soft tissue work, yoga stetch and strength  PT Home Exercise Plan  Access Code: [email or web address]     Consulted and Agree with Plan of Care  Patient       Patient will benefit from skilled therapeutic intervention in order to improve the following deficits and impairments:  Increased fascial  restricitons, Pain, Decreased coordination, Increased muscle spasms, Decreased strength, Decreased range of motion, Decreased activity tolerance  Visit Diagnosis: Muscle weakness (generalized)  Other muscle spasm     Problem List Patient Active Problem List   Diagnosis Date Noted  . Otosclerosis 08/09/2013    Eulis Foster, PT 07/08/17 1:20 PM   [facility] [address] [place], [place], [address] Phone: [phone]   Fax:  [phone]  Name: Sonya Snyder MRN: [id number] Date of Birth: Feb 10, 1971

## 2017-07-13 ENCOUNTER — Ambulatory Visit: Payer: Commercial Managed Care - PPO | Admitting: Physical Therapy

## 2017-07-13 ENCOUNTER — Encounter: Payer: Self-pay | Admitting: Physical Therapy

## 2017-07-13 DIAGNOSIS — M62838 Other muscle spasm: Secondary | ICD-10-CM

## 2017-07-13 DIAGNOSIS — M6281 Muscle weakness (generalized): Secondary | ICD-10-CM | POA: Diagnosis not present

## 2017-07-13 NOTE — Therapy (Signed)
[facility] [address], [address] [place], [place], [address] Phone: [phone]   Fax:  [phone]  Physical Therapy Treatment  Patient Details  Name: Sonya Snyder MRN: [id number] Date of Birth: May 17, 1970 Referring Provider: Dr. Lafe Garin   Encounter Date: 07/13/2017  PT End of Session - 07/13/17 1322    Visit Number  6    Date for PT Re-Evaluation  08/06/17    PT Start Time  1230    PT Stop Time  1310    PT Time Calculation (min)  40 min    Activity Tolerance  Patient tolerated treatment well    Behavior During Therapy  [facility] for tasks assessed/performed       Past Medical History:  Diagnosis Date  . Achilles tendinitis   . Asthma   . Asthma    exercise induced  . Erythema nodosum    with COC's  . GERD (gastroesophageal reflux disease)   . Heart murmur   . Hiatal hernia   . Hypothyroidism   . Menorrhagia   . MVP (mitral valve prolapse)   . PONV (postoperative nausea and vomiting)   . Thyroid disease    hypothyroid    Past Surgical History:  Procedure Laterality Date  . APPENDECTOMY    . ENDOMETRIAL ABLATION W/ NOVASURE    . EYE SURGERY     Lasix 2005 done  [place]  . STAPEDECTOMY Left 08/09/2013   Procedure: LEFT STAPEDECTOMY;  Surgeon: Serena Colonel, MD;  Location: [facility];  Service: ENT;  Laterality: Left;  Marland Kitchen VAGINAL HYSTERECTOMY  05/08/2017   added rectocele and cystocele repair, midurethral sling    There were no vitals filed for this visit.  Subjective Assessment - 07/13/17 1237    Subjective  I have pressure feeling and I think I may have a UTI. No change in the pressure during the day.     Patient Stated Goals  good vaginal health, reduce urinary leakage, sex without pain.     Currently in Pain?  Yes    Pain Score  3     Pain Location  Vagina    Pain Descriptors / Indicators  Pressure    Pain Type  Acute pain    Pain Onset  More than a month ago    Pain Frequency   Intermittent    Aggravating Factors   no sure with the pressure    Pain Relieving Factors  not sure    Multiple Pain Sites  No                    Pelvic Floor Special Questions - 07/13/17 0001    Falling out feeling (prolapse)  No    Prolapse  None    Pelvic Floor Internal Exam  Patient confirmed identification and approves PT to treat and assess the pelvic floor    Exam Type  Vaginal    Palpation  tenderness located on bil. ischiocavernsous        [facility] Adult PT Treatment/Exercise - 07/13/17 0001      Lumbar Exercises: Aerobic   Nustep  level 3 for 6 min arms and legs      Manual Therapy   Manual Therapy  Internal Pelvic Floor;Soft tissue mobilization;Myofascial release    Soft tissue mobilization  bilateral ischiocavernosus    Myofascial Release  release of the urgenital diaphgram going through 3 planes of fascia    Internal Pelvic Floor  release over the  bladder             PT Education - 07/13/17 1321    Education provided  Yes    Education Details  Access Code: [email or web address]     Person(s) Educated  Patient    Methods  Explanation;Demonstration;Verbal cues;Handout    Comprehension  Returned demonstration;Verbalized understanding       PT Short Term Goals - 06/26/17 0930      PT SHORT TERM GOAL #1   Title  independent with initial HEP with LE stretches    Time  4    Period  Weeks    Status  Achieved      PT SHORT TERM GOAL #2   Title  understand how to toilet correctly to not strain the pelvic floor    Time  4    Period  Weeks    Status  Achieved      PT SHORT TERM GOAL #3   Title  able to brace abdominals and contract the pelvic floor correctly     Time  4    Period  Weeks    Status  Achieved      PT SHORT TERM GOAL #4   Title  understand how to lift correctly no more thatn 10# until orders are changed from MD    Time  4    Period  Weeks    Status  Achieved        PT Long Term Goals - 07/08/17 1319      PT LONG TERM GOAL #1    Title  independent with HEP and understand how to progress herself    Time  8    Period  Weeks    Status  On-going      PT LONG TERM GOAL #2   Title  ability to walk 30 minutes per day without increased in pelvic floor pressure due to endurance of pelvic floor muscles    Time  8    Period  Weeks    Status  On-going      PT LONG TERM GOAL #3   Title  urine stream is strong and does not have to double void due to improve pelvic floor strength    Time  8    Period  Weeks    Status  On-going      PT LONG TERM GOAL #4   Title  at the end of work day pelvic pressure decreased >/= 75% due to improved pelvic floor and core strength    Time  8    Period  Weeks    Status  On-going            Plan - 07/13/17 1240    Clinical Impression Statement  Patient is having a pressure feeling since friday and check for prolapse and none was seen.  Once soft tissue work was done on the ischiocavernosus and release of the urogenital diaphgram the pressure feeling was gone.  Patient will benefit from skilled therapy to improve strength and improve function as she is recovering from her vaginal hysterectomy.     Rehab Potential  Excellent    Clinical Impairments Affecting Rehab Potential  No lifting >/= 10#; No internal work till 06/25/2017;    PT Duration  8 weeks    PT Treatment/Interventions  Biofeedback;Cryotherapy;Electrical Stimulation;Moist Heat;Ultrasound;Therapeutic exercise;Therapeutic activities;Neuromuscular re-education;Patient/family education;Manual techniques;Dry needling    PT Next Visit Plan  continue with soft tissue work, yoga stetch and strength    PT  Home Exercise Plan  Access Code: [email or web address]     Consulted and Agree with Plan of Care  Patient       Patient will benefit from skilled therapeutic intervention in order to improve the following deficits and impairments:  Increased fascial restricitons, Pain, Decreased coordination, Increased muscle spasms, Decreased strength,  Decreased range of motion, Decreased activity tolerance  Visit Diagnosis: Other muscle spasm  Muscle weakness (generalized)     Problem List Patient Active Problem List   Diagnosis Date Noted  . Otosclerosis 08/09/2013    Eulis Foster, PT 07/13/17 1:22 PM   [facility] [address], [address] [place], [place], [address] Phone: [phone]   Fax:  [phone]  Name: VETA DAMBROSIA MRN: [id number] Date of Birth: 25-Nov-1970

## 2017-07-13 NOTE — Patient Instructions (Signed)
Access Code: [email or web address]  URL: [email or web address]  Date: 07/13/2017  Prepared by: Eulis Foster   Exercises  Child's Pose with Thread the Needle - 2 reps - 1 sets - 30 hold - 1x daily - 7x weekly  Downward Dog - 2 reps - 1 sets - 15 hold - 1x daily - 7x weekly  Warrior II - 2 reps - 1 sets - 15 hold - 1x daily - 7x weekly  Triangle Pose - 2 reps - 1 sets - 15 hold - 1x daily - 7x weekly  Tree Pose - 2 reps - 1 sets - 15 hold - 1x daily - 7x weekly  [facility] [address] [place], [place] [address] Phone # [phone] Fax [phone]

## 2017-07-21 ENCOUNTER — Encounter: Payer: PRIVATE HEALTH INSURANCE | Admitting: Physical Therapy

## 2017-07-30 ENCOUNTER — Ambulatory Visit: Payer: Commercial Managed Care - PPO | Admitting: Physical Therapy

## 2017-07-30 ENCOUNTER — Encounter: Payer: Self-pay | Admitting: Physical Therapy

## 2017-07-30 DIAGNOSIS — M62838 Other muscle spasm: Secondary | ICD-10-CM | POA: Diagnosis not present

## 2017-07-30 DIAGNOSIS — M6281 Muscle weakness (generalized): Secondary | ICD-10-CM

## 2017-07-30 NOTE — Therapy (Signed)
[facility] [address], [address] [place], [place], [address] Phone: [phone]   Fax:  [phone]  Physical Therapy Treatment  Patient Details  Name: Sonya Snyder MRN: [id number] Date of Birth: November 02, 1970 Referring Provider: Dr. Lafe Garin   Encounter Date: 07/30/2017  PT End of Session - 07/30/17 1310    Visit Number  7    Date for PT Re-Evaluation  08/06/17    Authorization Type  [employer]HC    PT Start Time  1230    PT Stop Time  1308    PT Time Calculation (min)  38 min    Activity Tolerance  Patient tolerated treatment well    Behavior During Therapy  [facility] for tasks assessed/performed       Past Medical History:  Diagnosis Date  . Achilles tendinitis   . Asthma   . Asthma    exercise induced  . Erythema nodosum    with COC's  . GERD (gastroesophageal reflux disease)   . Heart murmur   . Hiatal hernia   . Hypothyroidism   . Menorrhagia   . MVP (mitral valve prolapse)   . PONV (postoperative nausea and vomiting)   . Thyroid disease    hypothyroid    Past Surgical History:  Procedure Laterality Date  . APPENDECTOMY    . ENDOMETRIAL ABLATION W/ NOVASURE    . EYE SURGERY     Lasix 2005 done  [place]  . STAPEDECTOMY Left 08/09/2013   Procedure: LEFT STAPEDECTOMY;  Surgeon: Serena Colonel, MD;  Location: [facility];  Service: ENT;  Laterality: Left;  Marland Kitchen VAGINAL HYSTERECTOMY  05/08/2017   added rectocele and cystocele repair, midurethral sling    There were no vitals filed for this visit.  Subjective Assessment - 07/30/17 1244    Subjective  I am feeling good.  I rode my bike 6.5 miles with no issues. No urinary leakage.     Patient Stated Goals  good vaginal health, reduce urinary leakage, sex without pain.     Currently in Pain?  No/denies    Multiple Pain Sites  No                       [facility] Adult PT Treatment/Exercise - 07/30/17 0001      Lumbar Exercises: Aerobic   Nustep  level 4 for 6 min arms and legs             PT Education - 07/30/17 1301    Education provided  Yes    Education Details  Access Code: [email or web address]  and [email or web address]    Person(s) Educated  Patient    Methods  Demonstration;Explanation;Verbal cues;Handout    Comprehension  Verbalized understanding;Returned demonstration       PT Short Term Goals - 06/26/17 0930      PT SHORT TERM GOAL #1   Title  independent with initial HEP with LE stretches    Time  4    Period  Weeks    Status  Achieved      PT SHORT TERM GOAL #2   Title  understand how to toilet correctly to not strain the pelvic floor    Time  4    Period  Weeks    Status  Achieved      PT SHORT TERM GOAL #3   Title  able to brace abdominals and contract the pelvic floor correctly     Time  4  Period  Weeks    Status  Achieved      PT SHORT TERM GOAL #4   Title  understand how to lift correctly no more thatn 10# until orders are changed from MD    Time  4    Period  Weeks    Status  Achieved        PT Long Term Goals - 07/30/17 1310      PT LONG TERM GOAL #1   Title  independent with HEP and understand how to progress herself    Baseline  still learning    Time  8    Period  Weeks    Status  On-going      PT LONG TERM GOAL #2   Title  ability to walk 30 minutes per day without increased in pelvic floor pressure due to endurance of pelvic floor muscles    Time  8    Period  Weeks    Status  On-going      PT LONG TERM GOAL #3   Title  urine stream is strong and does not have to double void due to improve pelvic floor strength    Time  8    Period  Weeks    Status  On-going      PT LONG TERM GOAL #4   Title  at the end of work day pelvic pressure decreased >/= 75% due to improved pelvic floor and core strength    Time  8    Period  Weeks    Status  On-going            Plan - 07/30/17 1246    Clinical Impression Statement  Patient was able to ride her bike for 6.5 miles without  issues.  Patient reports no urinary leakage. Patient is able to exercise for 38 minutes without increased pressure.  Patient will benefit from skilled therapy to improve strength and improve function as she is recovering from her vaginal hysterectomy.     Rehab Potential  Excellent    Clinical Impairments Affecting Rehab Potential  No lifting >/= 10#; No internal work till 06/25/2017;    PT Frequency  2x / week    PT Duration  8 weeks    PT Treatment/Interventions  Biofeedback;Cryotherapy;Electrical Stimulation;Moist Heat;Ultrasound;Therapeutic exercise;Therapeutic activities;Neuromuscular re-education;Patient/family education;Manual techniques;Dry needling    PT Next Visit Plan  discharge next visit if ready    PT Home Exercise Plan  Access Code: [email or web address]     Consulted and Agree with Plan of Care  Patient       Patient will benefit from skilled therapeutic intervention in order to improve the following deficits and impairments:  Increased fascial restricitons, Pain, Decreased coordination, Increased muscle spasms, Decreased strength, Decreased range of motion, Decreased activity tolerance  Visit Diagnosis: Other muscle spasm  Muscle weakness (generalized)     Problem List Patient Active Problem List   Diagnosis Date Noted  . Otosclerosis 08/09/2013    Eulis Foster, PT 07/30/17 1:16 PM   [facility] [address] [place], [place], [address] Phone: [phone]   Fax:  [phone]  Name: Sonya Snyder MRN: [id number] Date of Birth: November 13, 1970

## 2017-07-30 NOTE — Patient Instructions (Signed)
Access Code: [email or web address]  URL: [email or web address]  Date: 07/30/2017  Prepared by: Eulis Foster   Exercises  Child's Pose with Thread the Needle - 2 reps - 1 sets - 30 hold - 1x daily - 7x weekly  Downward Dog - 2 reps - 1 sets - 15 hold - 1x daily - 7x weekly  Warrior II - 2 reps - 1 sets - 15 hold - 1x daily - 7x weekly  Triangle Pose - 2 reps - 1 sets - 15 hold - 1x daily - 7x weekly  Tree Pose - 2 reps - 1 sets - 15 hold - 1x daily - 7x weekly  Dead Bug - 15 reps - 1 sets - 1x daily - 7x weekly  Marching Bridge - 10 reps - 1 sets - 1x daily - 7x weekly  Plank on Knees - 3 reps - 1 sets - 15 hold - 1x daily - 7x weekly  Side Plank on Knees - 3 reps - 1 sets - 10 hold - 1x daily - 7x weekly  [facility] [address] [place], [place] [address] Phone # [phone] Fax [phone]

## 2017-08-04 ENCOUNTER — Ambulatory Visit: Payer: Commercial Managed Care - PPO | Attending: Obstetrics and Gynecology | Admitting: Physical Therapy

## 2017-08-04 DIAGNOSIS — M62838 Other muscle spasm: Secondary | ICD-10-CM

## 2017-08-04 DIAGNOSIS — M6281 Muscle weakness (generalized): Secondary | ICD-10-CM | POA: Diagnosis not present

## 2017-08-04 NOTE — Therapy (Signed)
[facility] [address] [place], [place], [address] Phone: [phone]   Fax:  [phone]  Physical Therapy Treatment  Patient Details  Name: Sonya Snyder MRN: [id number] Date of Birth: 12-25-1970 Referring Provider: Dr. Blima Rich   Encounter Date: 08/04/2017  PT End of Session - 08/04/17 0807    Visit Number  8    Date for PT Re-Evaluation  08/06/17    Authorization Type  [employer]    PT Start Time  0800    PT Stop Time  0832    PT Time Calculation (min)  32 min    Activity Tolerance  Patient tolerated treatment well    Behavior During Therapy  [facility] for tasks assessed/performed       Past Medical History:  Diagnosis Date  . Achilles tendinitis   . Asthma   . Asthma    exercise induced  . Erythema nodosum    with COC's  . GERD (gastroesophageal reflux disease)   . Heart murmur   . Hiatal hernia   . Hypothyroidism   . Menorrhagia   . MVP (mitral valve prolapse)   . PONV (postoperative nausea and vomiting)   . Thyroid disease    hypothyroid    Past Surgical History:  Procedure Laterality Date  . APPENDECTOMY    . ENDOMETRIAL ABLATION W/ NOVASURE    . EYE SURGERY     [place] 2005 done  [place]  . STAPEDECTOMY Left 08/09/2013   Procedure: LEFT STAPEDECTOMY;  Surgeon: Izora Gala, MD;  Location: [place];  Service: ENT;  Laterality: Left;  Marland Kitchen VAGINAL HYSTERECTOMY  05/08/2017   added rectocele and cystocele repair, midurethral sling    There were no vitals filed for this visit.  Subjective Assessment - 08/04/17 0804    Subjective  I feel good about today being my last day. I went for walks. I still have the urinary urgency.     Patient Stated Goals  good vaginal health, reduce urinary leakage, sex without pain.     Currently in Pain?  No/denies    Multiple Pain Sites  No         [facility] PT Assessment - 08/04/17 0001      Assessment   Medical Diagnosis  M62.838 Levator Spasm    Referring Provider  Dr. Blima Rich    Onset Date/Surgical Date  05/08/17    Prior Therapy  None      Precautions   Precautions  Other (comment)    Precaution Comments  no internal work till 06/25/2017; No lifting more than 10#      Restrictions   Weight Bearing Restrictions  No      Home Film/video editor residence      Prior Function   Level of Independence  Independent    Vocation  Full time employment    Vocation Requirements  sitting    Leisure  elliptical with no impact, walking 30 minutes per day      Cognition   Overall Cognitive Status  Within Functional Limits for tasks assessed      Posture/Postural Control   Posture/Postural Control  No significant limitations      AROM   Lumbar Extension  full    Lumbar - Right Side Bend  full    Lumbar - Left Side Bend  full      PROM   Overall PROM   Within functional limits for tasks performed  Strength   Right Hip External Rotation   5/5    Right Hip ABduction  5/5    Left Hip External Rotation  5/5    Left Hip ABduction  5/5    Right Knee Extension  5/5    Left Knee Extension  5/5      Palpation   SI assessment   pelvis in correct alignment      Pelvic Dictraction   Findings  Negative    Comment  no pain      Transfers   Transfers  Not assessed      Ambulation/Gait   Ambulation/Gait  No                Pelvic Floor Special Questions - 08/04/17 0001    Prolapse  None    Pelvic Floor Internal Exam  Patient confirmed identification and approves PT to treat and assess the pelvic floor    Exam Type  Vaginal    Palpation  No tenderness    Strength  good squeeze, good lift, able to hold agaisnt strong resistance        [facility] Adult PT Treatment/Exercise - 08/04/17 0001      Self-Care   Self-Care  Other Self-Care Comments    Other Self-Care Comments   review urge to void technique, went over the toilieting technique      Lumbar Exercises: Aerobic   Nustep  level 4  for 6 min arms and legs      Lumbar Exercises: Supine   Bridge  15 reps;Limitations    Bridge Limitations  need hands on hips to keep pelvis steady      Manual Therapy   Manual Therapy  Internal Pelvic Floor    Internal Pelvic Floor  gentle soft tissue work to pelvic floor and tapping of the sides of the sphincter to perform a circular contraction               PT Short Term Goals - 06/26/17 0930      PT SHORT TERM GOAL #1   Title  independent with initial HEP with LE stretches    Time  4    Period  Weeks    Status  Achieved      PT SHORT TERM GOAL #2   Title  understand how to toilet correctly to not strain the pelvic floor    Time  4    Period  Weeks    Status  Achieved      PT SHORT TERM GOAL #3   Title  able to brace abdominals and contract the pelvic floor correctly     Time  4    Period  Weeks    Status  Achieved      PT SHORT TERM GOAL #4   Title  understand how to lift correctly no more thatn 10# until orders are changed from MD    Time  4    Period  Weeks    Status  Achieved        PT Long Term Goals - 08/04/17 9150      PT LONG TERM GOAL #1   Title  independent with HEP and understand how to progress herself    Time  8    Period  Weeks    Status  Achieved      PT LONG TERM GOAL #2   Title  ability to walk 30 minutes per day without increased in pelvic floor pressure due to  endurance of pelvic floor muscles    Time  8    Period  Weeks    Status  Achieved      PT LONG TERM GOAL #3   Title  urine stream is strong and does not have to double void due to improve pelvic floor strength    Baseline  has to do her breaths to relax    Time  8    Period  Weeks    Status  Partially Met      PT LONG TERM GOAL #4   Title  at the end of work day pelvic pressure decreased >/= 75% due to improved pelvic floor and core strength    Time  8    Period  Weeks    Status  Achieved            Plan - 08/04/17 0807    Clinical Impression Statement   Patient has full strength of bilateral hips.  Pelvic floor strength is 4/5 with tapping on the sides of the pelvic floor to make a circular contraction.  Patient is independent with her HEP and understands how to progress herself.  Patient sometimes has to double void when she does not fully relax her pelvic floor.  Patient still has some urgency but she is using the techniques to deter it.  Patient is ready for discharge.     Rehab Potential  Excellent    Clinical Impairments Affecting Rehab Potential  No lifting >/= 10#; No internal work till 06/25/2017;    PT Treatment/Interventions  Biofeedback;Cryotherapy;Electrical Stimulation;Moist Heat;Ultrasound;Therapeutic exercise;Therapeutic activities;Neuromuscular re-education;Patient/family education;Manual techniques;Dry needling    PT Next Visit Plan  Discharge to HEP this visit    PT Home Exercise Plan  Access Code: [email or web address]     Consulted and Agree with Plan of Care  Patient       Patient will benefit from skilled therapeutic intervention in order to improve the following deficits and impairments:  Increased fascial restricitons, Pain, Decreased coordination, Increased muscle spasms, Decreased strength, Decreased range of motion, Decreased activity tolerance  Visit Diagnosis: Other muscle spasm  Muscle weakness (generalized)     Problem List Patient Active Problem List   Diagnosis Date Noted  . Otosclerosis 08/09/2013    Earlie Counts, PT 08/04/17 8:41 AM   [facility] [address] [place], [place], [address] Phone: [phone]   Fax:  [phone]  Name: Sonya Snyder MRN: [id number] Date of Birth: [birth year] PHYSICAL THERAPY DISCHARGE SUMMARY  Visits from Start of Care: 8 Current functional level related to goals / functional outcomes: See above.    Remaining deficits: See above.    Education / Equipment: HEP Plan: Patient agrees to discharge.  Patient  goals were met. Patient is being discharged due to meeting the stated rehab goals.  Thank you for the referral. Earlie Counts, PT 08/04/17 8:41 AM  ?????

## 2017-11-29 ENCOUNTER — Encounter (HOSPITAL_COMMUNITY): Payer: Self-pay | Admitting: *Deleted

## 2017-11-29 ENCOUNTER — Ambulatory Visit (HOSPITAL_COMMUNITY)
Admission: EM | Admit: 2017-11-29 | Discharge: 2017-11-29 | Disposition: A | Discharge status: Home / Self Care | Payer: Commercial Managed Care - PPO | Attending: Family Medicine | Admitting: Family Medicine

## 2017-11-29 ENCOUNTER — Other Ambulatory Visit: Payer: Self-pay

## 2017-11-29 DIAGNOSIS — K219 Gastro-esophageal reflux disease without esophagitis: Secondary | ICD-10-CM | POA: Diagnosis not present

## 2017-11-29 DIAGNOSIS — R21 Rash and other nonspecific skin eruption: Secondary | ICD-10-CM | POA: Diagnosis present

## 2017-11-29 DIAGNOSIS — M7989 Other specified soft tissue disorders: Secondary | ICD-10-CM | POA: Insufficient documentation

## 2017-11-29 DIAGNOSIS — M79604 Pain in right leg: Secondary | ICD-10-CM | POA: Diagnosis not present

## 2017-11-29 DIAGNOSIS — Z880 Allergy status to penicillin: Secondary | ICD-10-CM | POA: Diagnosis not present

## 2017-11-29 DIAGNOSIS — I341 Nonrheumatic mitral (valve) prolapse: Secondary | ICD-10-CM | POA: Insufficient documentation

## 2017-11-29 DIAGNOSIS — L03115 Cellulitis of right lower limb: Secondary | ICD-10-CM | POA: Insufficient documentation

## 2017-11-29 DIAGNOSIS — Z79890 Hormone replacement therapy (postmenopausal): Secondary | ICD-10-CM | POA: Diagnosis not present

## 2017-11-29 DIAGNOSIS — Z882 Allergy status to sulfonamides status: Secondary | ICD-10-CM | POA: Diagnosis not present

## 2017-11-29 DIAGNOSIS — E039 Hypothyroidism, unspecified: Secondary | ICD-10-CM | POA: Insufficient documentation

## 2017-11-29 DIAGNOSIS — Z79899 Other long term (current) drug therapy: Secondary | ICD-10-CM | POA: Insufficient documentation

## 2017-11-29 DIAGNOSIS — B9689 Other specified bacterial agents as the cause of diseases classified elsewhere: Secondary | ICD-10-CM | POA: Diagnosis not present

## 2017-11-29 DIAGNOSIS — J45909 Unspecified asthma, uncomplicated: Secondary | ICD-10-CM | POA: Insufficient documentation

## 2017-11-29 DIAGNOSIS — M79605 Pain in left leg: Secondary | ICD-10-CM | POA: Insufficient documentation

## 2017-11-29 LAB — CBC WITH DIFFERENTIAL/PLATELET
Abs Immature Granulocytes: 0 10*3/uL (ref 0.0–0.1)
BASOS ABS: 0.1 10*3/uL (ref 0.0–0.1)
BASOS PCT: 1 %
EOS ABS: 0.2 10*3/uL (ref 0.0–0.7)
Eosinophils Relative: 2 %
HCT: 42.9 % (ref 36.0–46.0)
HEMOGLOBIN: 14.3 g/dL (ref 12.0–15.0)
Immature Granulocytes: 0 %
Lymphocytes Relative: 20 %
Lymphs Abs: 1.9 10*3/uL (ref 0.7–4.0)
MCH: 31.9 pg (ref 26.0–34.0)
MCHC: 33.3 g/dL (ref 30.0–36.0)
MCV: 95.8 fL (ref 78.0–100.0)
MONO ABS: 0.9 10*3/uL (ref 0.1–1.0)
Monocytes Relative: 9 %
NEUTROS ABS: 6.7 10*3/uL (ref 1.7–7.7)
Neutrophils Relative %: 68 %
Platelets: 244 10*3/uL (ref 150–400)
RBC: 4.48 MIL/uL (ref 3.87–5.11)
RDW: 12.5 % (ref 11.5–15.5)
WBC: 9.8 10*3/uL (ref 4.0–10.5)

## 2017-11-29 MED ORDER — DOXYCYCLINE HYCLATE 100 MG PO CAPS: 100.0000 mg | ORAL_CAPSULE | Freq: Two times a day (BID) | ORAL | 0 refills | Status: AC

## 2017-11-29 MED ORDER — TRAMADOL HCL 50 MG PO TABS
50.0000 mg | ORAL_TABLET | Freq: Four times a day (QID) | ORAL | 0 refills | Status: DC | PRN
Start: 2017-11-29 — End: 2022-08-01

## 2017-11-29 NOTE — ED Provider Notes (Signed)
[facility]    CSN: [id number] Arrival date & time: 11/29/17  1015     History   Chief Complaint Chief Complaint  Patient presents with  . Rash    HPI Sonya Snyder is a 47 y.o. female.   Presents for evaluation of right bumps appearing on leg x2 days ago.  Associated symptoms include generalized bilateral leg pain and body achiness.  She denies any known contact with insects or spiders.  She had a similar lesion occur on her left arm 2 weeks ago which resolved with a topical antibiotic ointment.  Concerned today as she is also experiencing some erythema of the right foot and left foot without lesions.  Two of the bumps are blistering and is draining clear.  She denies chills, fever, nausea, or vomiting.  Past Medical History:  Diagnosis Date  . Achilles tendinitis   . Asthma   . Asthma    exercise induced  . Erythema nodosum    with COC's  . GERD (gastroesophageal reflux disease)   . Heart murmur   . Hiatal hernia   . Hypothyroidism   . Menorrhagia   . MVP (mitral valve prolapse)   . PONV (postoperative nausea and vomiting)   . Thyroid disease    hypothyroid    Patient Active Problem List   Diagnosis Date Noted  . Otosclerosis 08/09/2013    Past Surgical History:  Procedure Laterality Date  . APPENDECTOMY    . ENDOMETRIAL ABLATION W/ NOVASURE    . EYE SURGERY     [place]ix 2005 done  [place]  . STAPEDECTOMY Left 08/09/2013   Procedure: LEFT STAPEDECTOMY;  Surgeon: Serena Colonel, MD;  Location: [facility];  Service: ENT;  Laterality: Left;  Marland Kitchen VAGINAL HYSTERECTOMY  05/08/2017   added rectocele and cystocele repair, midurethral sling    OB History    Gravida  3   Para  2   Term  2   Preterm      AB  1   Living  2     SAB  1   TAB      Ectopic      Multiple      Live Births  2            Home Medications    Prior to Admission medications   Medication Sig Start Date End Date Taking? Authorizing Provider    bacitracin 500 UNIT/GM ointment Apply 1 application topically 2 (two) times daily.   Yes [provider]  diphenhydrAMINE (BENADRYL) 50 MG tablet Take 50 mg by mouth at bedtime as needed.     Yes [provider]  docusate sodium (COLACE) 100 MG capsule Take 100 mg by mouth 2 (two) times daily.   Yes [provider]  hydrocortisone cream 1 % Apply 1 application topically 2 (two) times daily.   Yes [provider]  IBUPROFEN PO Take by mouth.   Yes [provider]  Levocetirizine Dihydrochloride (XYZAL PO) Take by mouth.   Yes [provider]  levothyroxine (SYNTHROID, LEVOTHROID) 112 MCG tablet Take 125 mcg by mouth daily.    Yes [provider]  ALBUTEROL IN Inhale into the lungs. Use as needed    [provider]  ciprofloxacin-dexamethasone (CIPRODEX) otic suspension Place 3 drops into the left ear 3 (three) times daily. Patient not taking: Reported on 06/11/2017 08/10/13   Serena Colonel, MD  diazepam (VALIUM) 5 MG tablet Take 1 tablet (5 mg total)  by mouth every 6 (six) hours as needed for anxiety. 08/10/13   Serena Colonel, MD  doxycycline (VIBRAMYCIN) 100 MG capsule Take 1 capsule (100 mg total) by mouth 2 (two) times daily for 7 days. 11/29/17 12/06/17  Bing Neighbors, FNP  HYDROcodone-acetaminophen (NORCO) 7.5-325 MG per tablet Take 1 tablet by mouth every 6 (six) hours as needed for moderate pain. Patient not taking: Reported on 06/11/2017 08/10/13   Serena Colonel, MD  Omeprazole (PRILOSEC PO) Take by mouth as needed.    [provider]  promethazine (PHENERGAN) 25 MG suppository Place 1 suppository (25 mg total) rectally every 6 (six) hours as needed for nausea or vomiting. Patient not taking: Reported on 06/11/2017 08/10/13   Serena Colonel, MD  Pseudoephedrine HCl (SUDAFED 12 HOUR PO) Take by mouth as needed.    [provider]  traMADol (ULTRAM) 50 MG tablet Take 1 tablet (50 mg total) by mouth every 6 (six)  hours as needed. 11/29/17   Bing Neighbors, FNP    Family History Family History  Adopted: Yes  Problem Relation Age of Onset  . Diabetes Other   . Thyroid disease Mother   . Depression Mother   . Osteoporosis Mother   . Hypertension Father   . Diabetes Father   . Depression Father   . Thyroid disease Sister     Social History Social History   Tobacco Use  . Smoking status: Never Smoker  . Smokeless tobacco: Never Used  Substance Use Topics  . Alcohol use: Yes    Comment: occas  . Drug use: No     Allergies   Penicillins; Sulfa antibiotics; and Latex   Review of Systems Review of Systems   Physical Exam Triage Vital Signs ED Triage Vitals  Enc Vitals Group     BP 11/29/17 1056 (!) 142/94     Pulse Rate 11/29/17 1056 74     Resp 11/29/17 1056 16     Temp 11/29/17 1056 98.4 F (36.9 C)     Temp Source 11/29/17 1056 Oral     SpO2 11/29/17 1056 100 %     Weight --      Height --      Head Circumference --      Peak Flow --      Pain Score 11/29/17 1057 4     Pain Loc --      Pain Edu? --      Excl. in GC? --    No data found.  Updated Vital Signs BP (!) 142/94   Pulse 74   Temp 98.4 F (36.9 C) (Oral)   Resp 16   SpO2 100%   Visual Acuity Right Eye Distance:   Left Eye Distance:   Bilateral Distance:    Right Eye Near:   Left Eye Near:    Bilateral Near:     Physical Exam  Neck: Normal range of motion.  Cardiovascular: Normal rate, regular rhythm and normal heart sounds.  Pulmonary/Chest: Effort normal and breath sounds normal.  Musculoskeletal:       Legs: Skin: Rash noted. Rash is papular.  Psychiatric: Her mood appears anxious.     UC Treatments / Results  Labs (all labs ordered are listed, but only abnormal results are displayed) Labs Reviewed  CBC WITH DIFFERENTIAL/PLATELET    EKG None  Radiology No results found.  Procedures Procedures (including critical care time)  Medications Ordered in UC Medications -  No data to display  Initial Impression /  Assessment and Plan / UC Course  I have reviewed the triage vital signs and the nursing notes.  Pertinent labs & imaging results that were available during my care of the patient were reviewed by me and considered in my medical decision making (see chart for details).  Patient presents today with a complaint of blistering lesions lower right leg, erythema of the right foot,appearing without known insect bite or skin trauma. Patient has other non-specific symptoms present such as fatigue and generalized body aches. I will check CBC with diff today. Recommended follow-up with PCP for complete work-up and further evaluation. For cellulitis, start doxycyline 100 mg twice daily x 10 days. For pain, I have prescribed small quantity of tramadol for pain. Patient agreed to plan and verbalized understanding.  Final Clinical Impressions(s) / UC Diagnoses   Final diagnoses:  Cellulitis of leg, right  Foot swelling  Pain in both lower extremities   Discharge Instructions   None    ED Prescriptions    Medication Sig Dispense Auth. Provider   doxycycline (VIBRAMYCIN) 100 MG capsule Take 1 capsule (100 mg total) by mouth 2 (two) times daily for 7 days. 14 capsule Bing Neighbors, FNP   traMADol (ULTRAM) 50 MG tablet Take 1 tablet (50 mg total) by mouth every 6 (six) hours as needed. 15 tablet Bing Neighbors, FNP     Controlled Substance Prescriptions [place] Controlled Substance Registry consulted? Yes, I have consulted the [place] Controlled Substances Registry for this patient, and feel the risk/benefit ratio today is favorable for proceeding with this prescription for a controlled substance.   Bing Neighbors, FNP 11/30/17 2216

## 2017-11-29 NOTE — ED Triage Notes (Signed)
C/O pruritic, painful rash to bilat feet and right leg x 2 days.  No known exposures.

## 2018-01-05 ENCOUNTER — Other Ambulatory Visit: Payer: Self-pay | Admitting: Internal Medicine

## 2018-01-05 DIAGNOSIS — Z1231 Encounter for screening mammogram for malignant neoplasm of breast: Secondary | ICD-10-CM

## 2018-01-08 DIAGNOSIS — R05 Cough: Secondary | ICD-10-CM | POA: Diagnosis not present

## 2018-01-08 DIAGNOSIS — K219 Gastro-esophageal reflux disease without esophagitis: Secondary | ICD-10-CM | POA: Diagnosis not present

## 2018-01-08 DIAGNOSIS — E038 Other specified hypothyroidism: Secondary | ICD-10-CM | POA: Diagnosis not present

## 2018-01-08 DIAGNOSIS — J029 Acute pharyngitis, unspecified: Secondary | ICD-10-CM | POA: Diagnosis not present

## 2018-02-16 ENCOUNTER — Ambulatory Visit
Admission: RE | Admit: 2018-02-16 | Discharge: 2018-02-16 | Disposition: A | Discharge status: Home / Self Care | Payer: Commercial Managed Care - PPO | Source: Ambulatory Visit | Attending: Internal Medicine | Admitting: Internal Medicine

## 2018-02-16 DIAGNOSIS — Z1231 Encounter for screening mammogram for malignant neoplasm of breast: Secondary | ICD-10-CM | POA: Diagnosis not present

## 2018-03-16 DIAGNOSIS — E038 Other specified hypothyroidism: Secondary | ICD-10-CM | POA: Diagnosis not present

## 2018-03-16 DIAGNOSIS — Z0000 Encounter for general adult medical examination without abnormal findings: Secondary | ICD-10-CM | POA: Diagnosis not present

## 2018-03-23 DIAGNOSIS — Z0000 Encounter for general adult medical examination without abnormal findings: Secondary | ICD-10-CM | POA: Diagnosis not present

## 2018-03-23 DIAGNOSIS — J45990 Exercise induced bronchospasm: Secondary | ICD-10-CM | POA: Diagnosis not present

## 2018-03-23 DIAGNOSIS — L52 Erythema nodosum: Secondary | ICD-10-CM | POA: Diagnosis not present

## 2018-03-23 DIAGNOSIS — Z1389 Encounter for screening for other disorder: Secondary | ICD-10-CM | POA: Diagnosis not present

## 2018-03-23 DIAGNOSIS — Z23 Encounter for immunization: Secondary | ICD-10-CM | POA: Diagnosis not present

## 2018-03-23 DIAGNOSIS — E038 Other specified hypothyroidism: Secondary | ICD-10-CM | POA: Diagnosis not present

## 2018-11-19 DIAGNOSIS — N941 Unspecified dyspareunia: Secondary | ICD-10-CM | POA: Diagnosis not present

## 2018-12-14 DIAGNOSIS — N83202 Unspecified ovarian cyst, left side: Secondary | ICD-10-CM | POA: Diagnosis not present

## 2018-12-14 DIAGNOSIS — N941 Unspecified dyspareunia: Secondary | ICD-10-CM | POA: Diagnosis not present

## 2019-03-18 DIAGNOSIS — B372 Candidiasis of skin and nail: Secondary | ICD-10-CM | POA: Diagnosis not present

## 2019-03-18 DIAGNOSIS — L52 Erythema nodosum: Secondary | ICD-10-CM | POA: Diagnosis not present

## 2019-03-18 DIAGNOSIS — R21 Rash and other nonspecific skin eruption: Secondary | ICD-10-CM | POA: Diagnosis not present

## 2019-03-21 DIAGNOSIS — Z862 Personal history of diseases of the blood and blood-forming organs and certain disorders involving the immune mechanism: Secondary | ICD-10-CM | POA: Diagnosis not present

## 2019-03-21 DIAGNOSIS — Z0000 Encounter for general adult medical examination without abnormal findings: Secondary | ICD-10-CM | POA: Diagnosis not present

## 2019-03-21 DIAGNOSIS — E039 Hypothyroidism, unspecified: Secondary | ICD-10-CM | POA: Diagnosis not present

## 2019-03-28 DIAGNOSIS — J45990 Exercise induced bronchospasm: Secondary | ICD-10-CM | POA: Diagnosis not present

## 2019-03-28 DIAGNOSIS — E039 Hypothyroidism, unspecified: Secondary | ICD-10-CM | POA: Diagnosis not present

## 2019-03-28 DIAGNOSIS — F418 Other specified anxiety disorders: Secondary | ICD-10-CM | POA: Diagnosis not present

## 2019-03-28 DIAGNOSIS — Z0000 Encounter for general adult medical examination without abnormal findings: Secondary | ICD-10-CM | POA: Diagnosis not present

## 2019-03-28 DIAGNOSIS — L52 Erythema nodosum: Secondary | ICD-10-CM | POA: Diagnosis not present

## 2019-03-28 DIAGNOSIS — Z1331 Encounter for screening for depression: Secondary | ICD-10-CM | POA: Diagnosis not present

## 2019-09-21 DIAGNOSIS — Z20822 Contact with and (suspected) exposure to covid-19: Secondary | ICD-10-CM | POA: Diagnosis not present

## 2019-11-03 DIAGNOSIS — Z6830 Body mass index (BMI) 30.0-30.9, adult: Secondary | ICD-10-CM | POA: Diagnosis not present

## 2019-11-03 DIAGNOSIS — Z01419 Encounter for gynecological examination (general) (routine) without abnormal findings: Secondary | ICD-10-CM | POA: Diagnosis not present

## 2019-11-04 DIAGNOSIS — M5431 Sciatica, right side: Secondary | ICD-10-CM | POA: Diagnosis not present

## 2019-11-04 DIAGNOSIS — M545 Low back pain, unspecified: Secondary | ICD-10-CM | POA: Insufficient documentation

## 2019-11-04 DIAGNOSIS — M5136 Other intervertebral disc degeneration, lumbar region: Secondary | ICD-10-CM | POA: Diagnosis not present

## 2019-11-14 DIAGNOSIS — M545 Low back pain: Secondary | ICD-10-CM | POA: Diagnosis not present

## 2019-12-11 IMAGING — MG DIGITAL SCREENING BILATERAL MAMMOGRAM WITH TOMO AND CAD
8 series · 8 of 24 positions shown · non-contrast
Comparison: Previous exam(s).

CLINICAL DATA: Screening.

EXAM:
DIGITAL SCREENING BILATERAL MAMMOGRAM WITH TOMO AND CAD

[R CC synth-2D]
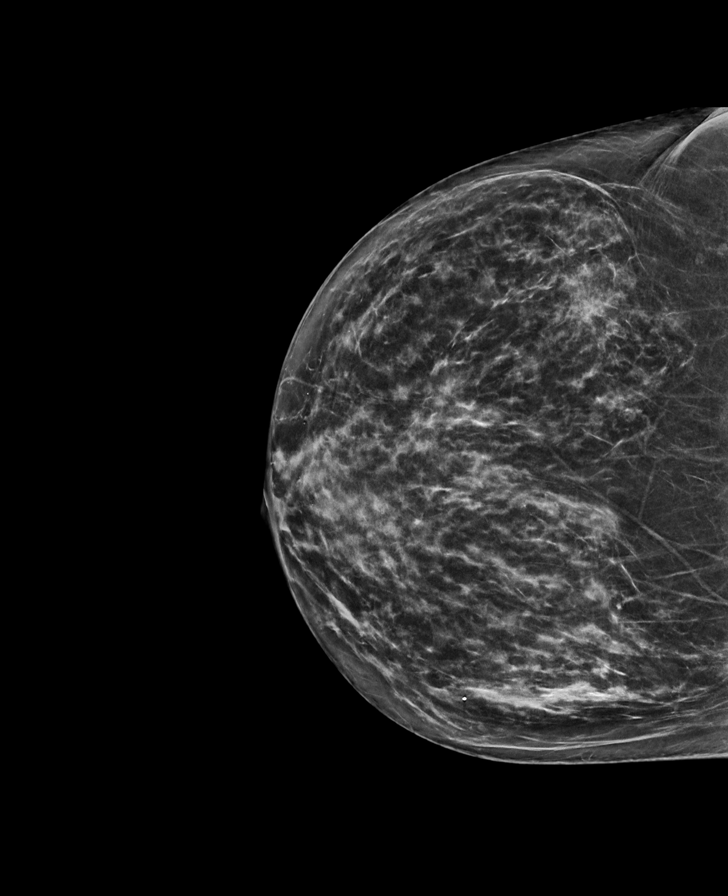

[L CC synth-2D]
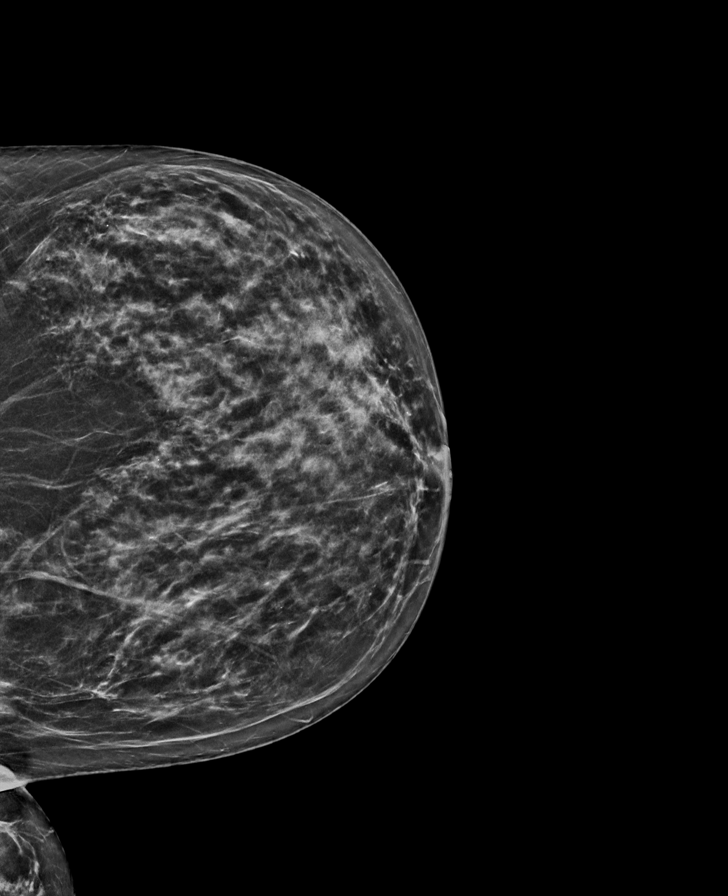

[L MLO synth-2D]
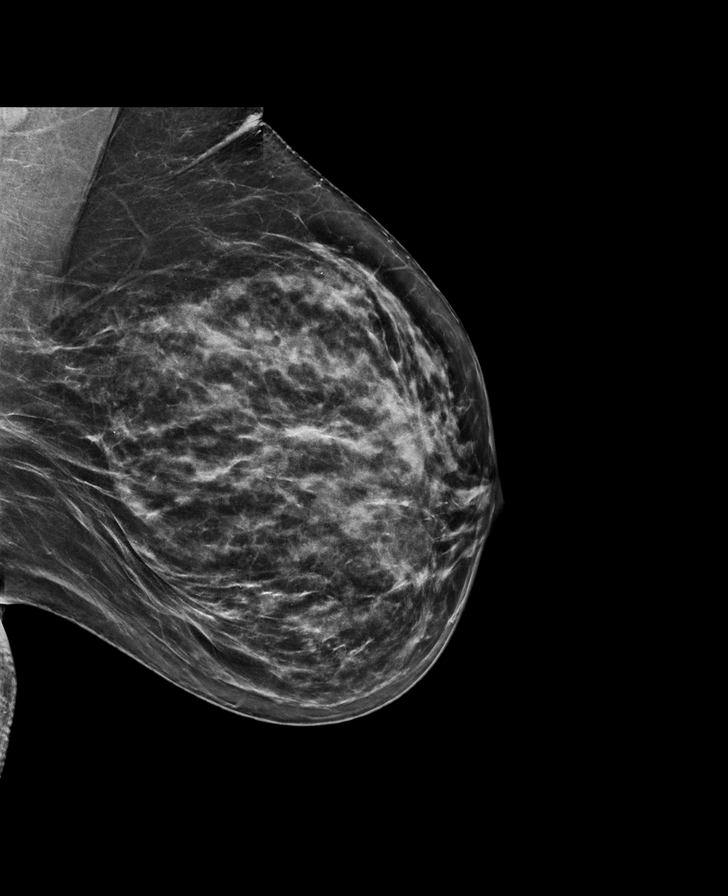

[R MLO synth-2D]
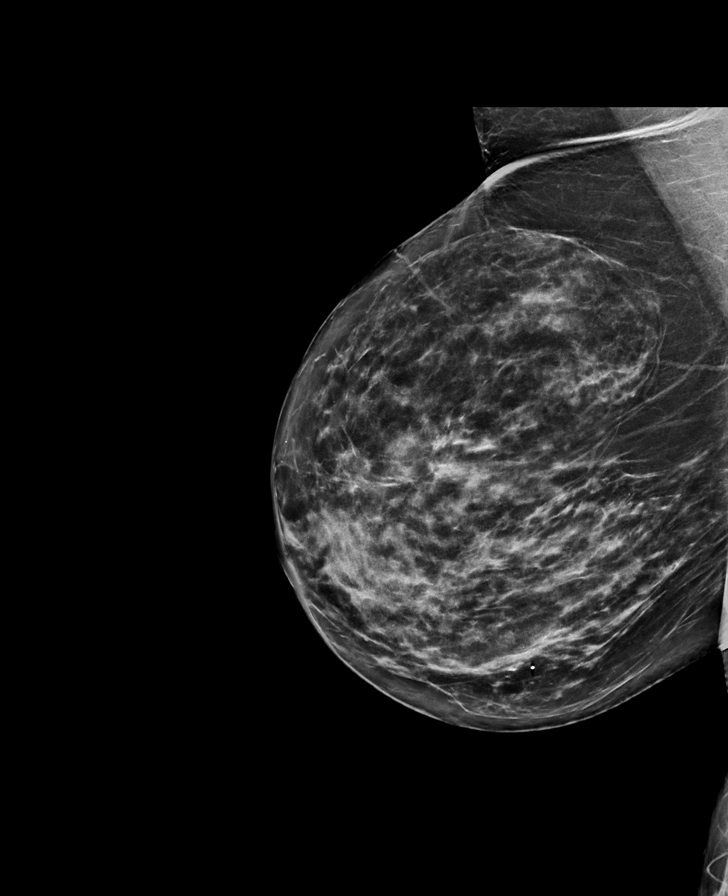

[R CC tomo · tomo slice 33/66.0]
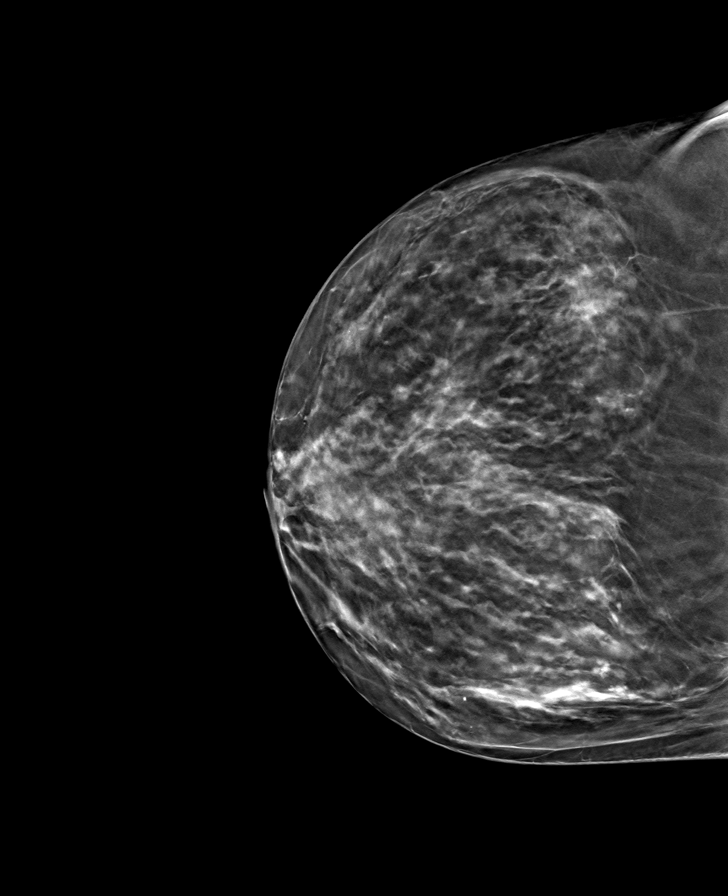

[L MLO tomo · tomo slice 37/73.0]
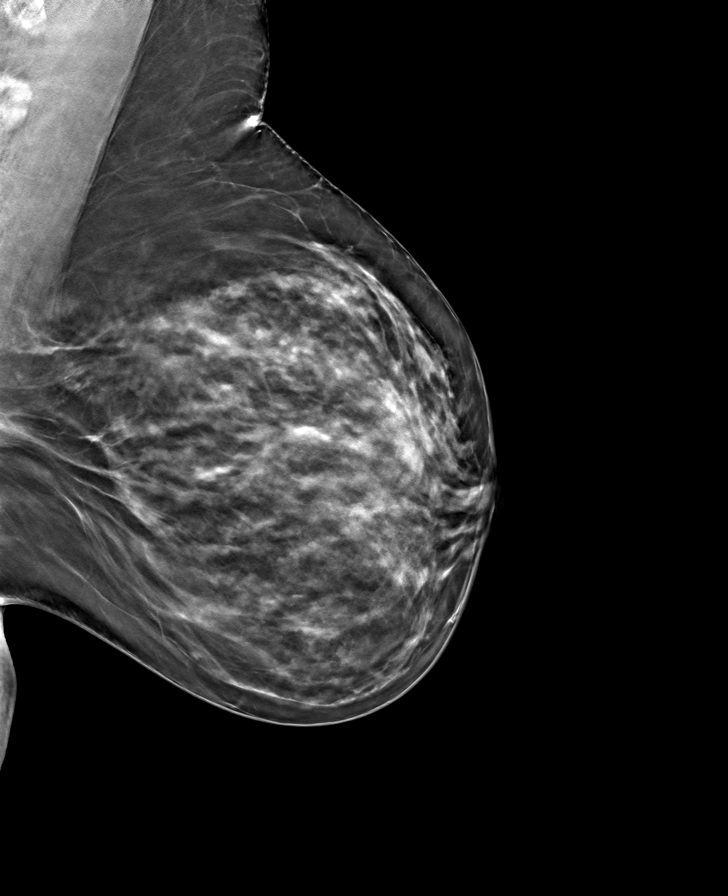

[L CC tomo · tomo slice 31/62.0]
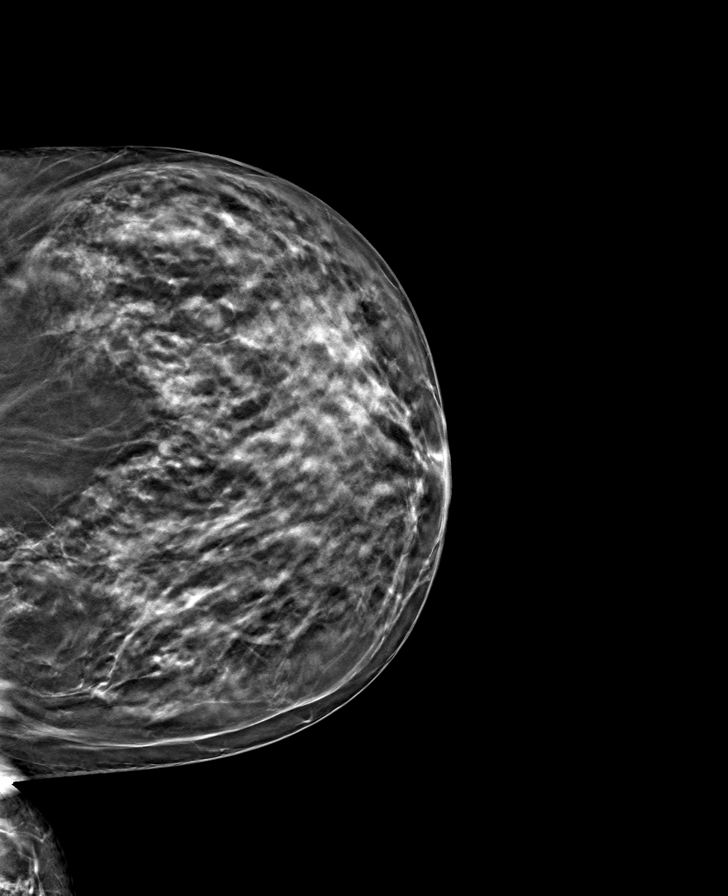

[R MLO tomo · tomo slice 38/75.0]
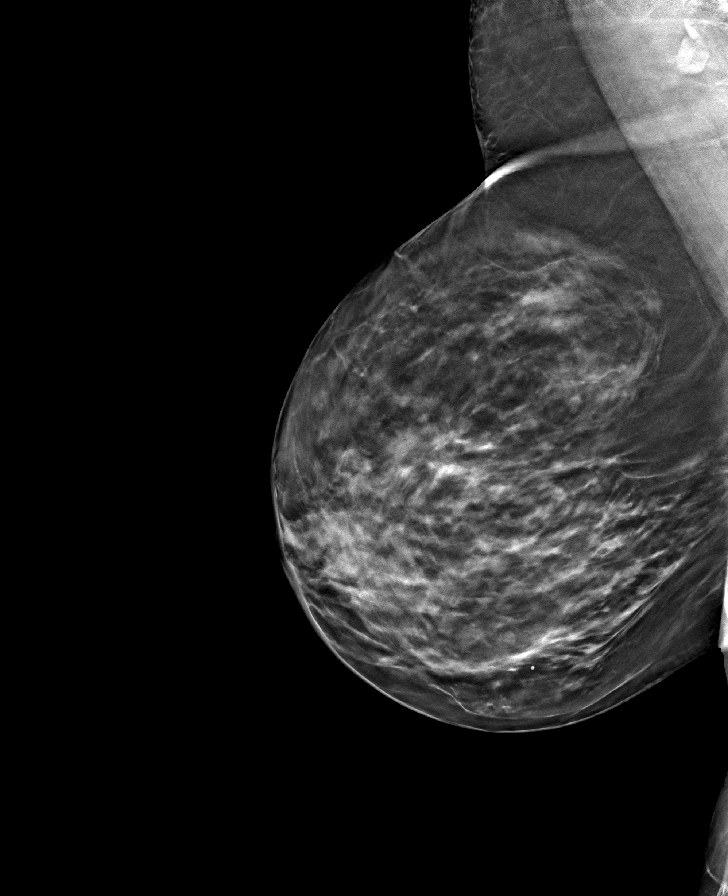

[8 of 24 positions shown; findings below may reference images not displayed]

ACR Breast Density Category c: The breast tissue is heterogeneously
dense, which may obscure small masses.
FINDINGS: There are no findings suspicious for malignancy. Images were
processed with CAD.
IMPRESSION: No mammographic evidence of malignancy. A result letter of this
screening mammogram will be mailed directly to the patient.

RECOMMENDATION:
Screening mammogram in one year. (Code:[id number])

BI-RADS CATEGORY  1: Negative.

## 2020-03-21 DIAGNOSIS — E039 Hypothyroidism, unspecified: Secondary | ICD-10-CM | POA: Diagnosis not present

## 2020-03-28 DIAGNOSIS — Z0000 Encounter for general adult medical examination without abnormal findings: Secondary | ICD-10-CM | POA: Diagnosis not present

## 2020-03-28 DIAGNOSIS — Z1339 Encounter for screening examination for other mental health and behavioral disorders: Secondary | ICD-10-CM | POA: Diagnosis not present

## 2020-03-28 DIAGNOSIS — Z1331 Encounter for screening for depression: Secondary | ICD-10-CM | POA: Diagnosis not present

## 2020-03-28 DIAGNOSIS — R82998 Other abnormal findings in urine: Secondary | ICD-10-CM | POA: Diagnosis not present

## 2020-03-28 DIAGNOSIS — E039 Hypothyroidism, unspecified: Secondary | ICD-10-CM | POA: Diagnosis not present

## 2020-04-22 DIAGNOSIS — R42 Dizziness and giddiness: Secondary | ICD-10-CM | POA: Diagnosis not present

## 2020-04-22 DIAGNOSIS — I10 Essential (primary) hypertension: Secondary | ICD-10-CM | POA: Diagnosis not present

## 2020-04-22 DIAGNOSIS — R457 State of emotional shock and stress, unspecified: Secondary | ICD-10-CM | POA: Diagnosis not present

## 2020-06-27 DIAGNOSIS — M79644 Pain in right finger(s): Secondary | ICD-10-CM | POA: Diagnosis not present

## 2020-06-27 DIAGNOSIS — Z23 Encounter for immunization: Secondary | ICD-10-CM | POA: Diagnosis not present

## 2021-03-03 DIAGNOSIS — R7309 Other abnormal glucose: Secondary | ICD-10-CM | POA: Diagnosis not present

## 2021-04-03 DIAGNOSIS — R7309 Other abnormal glucose: Secondary | ICD-10-CM | POA: Diagnosis not present

## 2021-04-17 DIAGNOSIS — E7800 Pure hypercholesterolemia, unspecified: Secondary | ICD-10-CM | POA: Diagnosis not present

## 2021-04-17 DIAGNOSIS — E039 Hypothyroidism, unspecified: Secondary | ICD-10-CM | POA: Diagnosis not present

## 2021-05-01 DIAGNOSIS — R7309 Other abnormal glucose: Secondary | ICD-10-CM | POA: Diagnosis not present

## 2021-05-14 DIAGNOSIS — Z1339 Encounter for screening examination for other mental health and behavioral disorders: Secondary | ICD-10-CM | POA: Diagnosis not present

## 2021-05-14 DIAGNOSIS — Z1331 Encounter for screening for depression: Secondary | ICD-10-CM | POA: Diagnosis not present

## 2021-05-14 DIAGNOSIS — E7800 Pure hypercholesterolemia, unspecified: Secondary | ICD-10-CM | POA: Diagnosis not present

## 2021-05-14 DIAGNOSIS — Z0000 Encounter for general adult medical examination without abnormal findings: Secondary | ICD-10-CM | POA: Diagnosis not present

## 2021-05-28 DIAGNOSIS — Z01419 Encounter for gynecological examination (general) (routine) without abnormal findings: Secondary | ICD-10-CM | POA: Diagnosis not present

## 2021-05-28 DIAGNOSIS — R300 Dysuria: Secondary | ICD-10-CM | POA: Diagnosis not present

## 2021-05-28 DIAGNOSIS — Z6829 Body mass index (BMI) 29.0-29.9, adult: Secondary | ICD-10-CM | POA: Diagnosis not present

## 2021-06-01 DIAGNOSIS — R7309 Other abnormal glucose: Secondary | ICD-10-CM | POA: Diagnosis not present

## 2021-07-01 DIAGNOSIS — R7309 Other abnormal glucose: Secondary | ICD-10-CM | POA: Diagnosis not present

## 2021-08-01 DIAGNOSIS — R7309 Other abnormal glucose: Secondary | ICD-10-CM | POA: Diagnosis not present

## 2021-08-12 DIAGNOSIS — M545 Low back pain, unspecified: Secondary | ICD-10-CM | POA: Diagnosis not present

## 2021-08-14 DIAGNOSIS — M545 Low back pain, unspecified: Secondary | ICD-10-CM | POA: Diagnosis not present

## 2021-08-23 DIAGNOSIS — M5418 Radiculopathy, sacral and sacrococcygeal region: Secondary | ICD-10-CM | POA: Diagnosis not present

## 2021-08-31 DIAGNOSIS — R7309 Other abnormal glucose: Secondary | ICD-10-CM | POA: Diagnosis not present

## 2021-10-01 DIAGNOSIS — R7309 Other abnormal glucose: Secondary | ICD-10-CM | POA: Diagnosis not present

## 2021-11-01 DIAGNOSIS — R7309 Other abnormal glucose: Secondary | ICD-10-CM | POA: Diagnosis not present

## 2021-11-07 DIAGNOSIS — D485 Neoplasm of uncertain behavior of skin: Secondary | ICD-10-CM | POA: Diagnosis not present

## 2021-11-07 DIAGNOSIS — L430 Hypertrophic lichen planus: Secondary | ICD-10-CM | POA: Diagnosis not present

## 2021-11-20 ENCOUNTER — Ambulatory Visit: Payer: Self-pay

## 2021-11-20 DIAGNOSIS — H01001 Unspecified blepharitis right upper eyelid: Secondary | ICD-10-CM | POA: Diagnosis not present

## 2021-12-01 DIAGNOSIS — R7309 Other abnormal glucose: Secondary | ICD-10-CM | POA: Diagnosis not present

## 2022-01-01 DIAGNOSIS — R7309 Other abnormal glucose: Secondary | ICD-10-CM | POA: Diagnosis not present

## 2022-01-31 DIAGNOSIS — R7309 Other abnormal glucose: Secondary | ICD-10-CM | POA: Diagnosis not present

## 2022-03-03 DIAGNOSIS — R7309 Other abnormal glucose: Secondary | ICD-10-CM | POA: Diagnosis not present

## 2022-04-03 DIAGNOSIS — R7309 Other abnormal glucose: Secondary | ICD-10-CM | POA: Diagnosis not present

## 2022-05-02 DIAGNOSIS — R7309 Other abnormal glucose: Secondary | ICD-10-CM | POA: Diagnosis not present

## 2022-05-19 ENCOUNTER — Other Ambulatory Visit: Payer: Self-pay | Admitting: Internal Medicine

## 2022-05-19 DIAGNOSIS — R7989 Other specified abnormal findings of blood chemistry: Secondary | ICD-10-CM | POA: Diagnosis not present

## 2022-05-19 DIAGNOSIS — Z1231 Encounter for screening mammogram for malignant neoplasm of breast: Secondary | ICD-10-CM

## 2022-05-19 DIAGNOSIS — E039 Hypothyroidism, unspecified: Secondary | ICD-10-CM | POA: Diagnosis not present

## 2022-05-19 DIAGNOSIS — E7800 Pure hypercholesterolemia, unspecified: Secondary | ICD-10-CM | POA: Diagnosis not present

## 2022-05-25 ENCOUNTER — Telehealth: Payer: Self-pay | Admitting: Internal Medicine

## 2022-05-25 NOTE — Telephone Encounter (Signed)
Patient needs screening colonoscopy and requests Dr. Hilarie Fredrickson, please

## 2022-05-26 DIAGNOSIS — Z0000 Encounter for general adult medical examination without abnormal findings: Secondary | ICD-10-CM | POA: Diagnosis not present

## 2022-05-26 DIAGNOSIS — Z1339 Encounter for screening examination for other mental health and behavioral disorders: Secondary | ICD-10-CM | POA: Diagnosis not present

## 2022-05-26 DIAGNOSIS — E039 Hypothyroidism, unspecified: Secondary | ICD-10-CM | POA: Diagnosis not present

## 2022-05-26 DIAGNOSIS — Z1331 Encounter for screening for depression: Secondary | ICD-10-CM | POA: Diagnosis not present

## 2022-05-26 DIAGNOSIS — E7800 Pure hypercholesterolemia, unspecified: Secondary | ICD-10-CM | POA: Diagnosis not present

## 2022-05-26 DIAGNOSIS — M545 Low back pain, unspecified: Secondary | ICD-10-CM | POA: Diagnosis not present

## 2022-05-26 DIAGNOSIS — F418 Other specified anxiety disorders: Secondary | ICD-10-CM | POA: Diagnosis not present

## 2022-05-26 NOTE — Telephone Encounter (Signed)
Patient has been scheduled for telephone previsit 08/01/22 and screening colonoscopy with Dr Hilarie Fredrickson 08/13/22.

## 2022-05-31 NOTE — Telephone Encounter (Signed)
Noted Thanks JMP 

## 2022-06-02 DIAGNOSIS — R7309 Other abnormal glucose: Secondary | ICD-10-CM | POA: Diagnosis not present

## 2022-06-16 DIAGNOSIS — N898 Other specified noninflammatory disorders of vagina: Secondary | ICD-10-CM | POA: Diagnosis not present

## 2022-06-16 DIAGNOSIS — N760 Acute vaginitis: Secondary | ICD-10-CM | POA: Diagnosis not present

## 2022-06-16 DIAGNOSIS — Z01411 Encounter for gynecological examination (general) (routine) with abnormal findings: Secondary | ICD-10-CM | POA: Diagnosis not present

## 2022-06-16 DIAGNOSIS — N951 Menopausal and female climacteric states: Secondary | ICD-10-CM | POA: Diagnosis not present

## 2022-07-02 ENCOUNTER — Ambulatory Visit
Admission: RE | Admit: 2022-07-02 | Discharge: 2022-07-02 | Disposition: A | Discharge status: Home / Self Care | Payer: BC Managed Care – PPO | Source: Ambulatory Visit | Attending: Internal Medicine | Admitting: Internal Medicine

## 2022-07-02 DIAGNOSIS — R7309 Other abnormal glucose: Secondary | ICD-10-CM | POA: Diagnosis not present

## 2022-07-02 DIAGNOSIS — Z1231 Encounter for screening mammogram for malignant neoplasm of breast: Secondary | ICD-10-CM

## 2022-07-29 DIAGNOSIS — J45909 Unspecified asthma, uncomplicated: Secondary | ICD-10-CM | POA: Insufficient documentation

## 2022-07-29 DIAGNOSIS — E039 Hypothyroidism, unspecified: Secondary | ICD-10-CM | POA: Insufficient documentation

## 2022-07-29 DIAGNOSIS — F419 Anxiety disorder, unspecified: Secondary | ICD-10-CM | POA: Insufficient documentation

## 2022-08-01 ENCOUNTER — Ambulatory Visit (AMBULATORY_SURGERY_CENTER): Payer: BC Managed Care – PPO

## 2022-08-01 VITALS — Ht 62.0 in | Wt 156.0 lb

## 2022-08-01 DIAGNOSIS — Z1211 Encounter for screening for malignant neoplasm of colon: Secondary | ICD-10-CM

## 2022-08-01 MED ORDER — NA SULFATE-K SULFATE-MG SULF 17.5-3.13-1.6 GM/177ML PO SOLN: 1.0000 | Freq: Once | ORAL | 0 refills | Status: AC

## 2022-08-01 NOTE — Progress Notes (Signed)

## 2022-08-01 NOTE — Addendum Note (Signed)
Addended by: Jaquelyn Bitter on: 08/01/2022 04:10 PM   Modules accepted: Orders

## 2022-08-02 DIAGNOSIS — R7309 Other abnormal glucose: Secondary | ICD-10-CM | POA: Diagnosis not present

## 2022-08-05 ENCOUNTER — Telehealth: Payer: Self-pay | Admitting: Internal Medicine

## 2022-08-05 ENCOUNTER — Encounter: Payer: Self-pay | Admitting: Internal Medicine

## 2022-08-05 NOTE — Telephone Encounter (Signed)
Per patient's request patient has been switched to Miralax split dose prep. New instructions printed and given to patient.

## 2022-08-05 NOTE — Telephone Encounter (Signed)
Patient requesting different colonoscopy prep than SuPrep - asking to do Miralax prep

## 2022-08-13 ENCOUNTER — Ambulatory Visit: Payer: BC Managed Care – PPO | Admitting: Internal Medicine

## 2022-08-13 ENCOUNTER — Encounter: Payer: Self-pay | Admitting: Internal Medicine

## 2022-08-13 VITALS — BP 124/74 | HR 67 | Temp 97.5°F | Resp 15 | Ht 62.0 in | Wt 156.0 lb

## 2022-08-13 DIAGNOSIS — D120 Benign neoplasm of cecum: Secondary | ICD-10-CM

## 2022-08-13 DIAGNOSIS — K635 Polyp of colon: Secondary | ICD-10-CM | POA: Diagnosis not present

## 2022-08-13 DIAGNOSIS — Z1211 Encounter for screening for malignant neoplasm of colon: Secondary | ICD-10-CM | POA: Diagnosis not present

## 2022-08-13 MED ORDER — SODIUM CHLORIDE 0.9 % IV SOLN: 500.0000 mL | Freq: Once | INTRAVENOUS | Status: DC

## 2022-08-13 NOTE — Op Note (Signed)
[facility] Patient Name: Sonya Snyder Procedure Date: 08/13/2022 9:12 AM MRN: [id number] Endoscopist: Beverley Fiedler , MD, [id number] Age: 52 Referring MD:  Date of Birth: 1970-07-19 Gender: Female Account #: [id number] Procedure:                Colonoscopy Indications:              Screening for colorectal malignant neoplasm Medicines:                Monitored Anesthesia Care Procedure:                Pre-Anesthesia Assessment:                           - Prior to the procedure, a History and Physical                            was performed, and patient medications and                            allergies were reviewed. The patient's tolerance of                            previous anesthesia was also reviewed. The risks                            and benefits of the procedure and the sedation                            options and risks were discussed with the patient.                            All questions were answered, and informed consent                            was obtained. Prior Anticoagulants: The patient has                            taken no anticoagulant or antiplatelet agents. ASA                            Grade Assessment: II - A patient with mild systemic                            disease. After reviewing the risks and benefits,                            the patient was deemed in satisfactory condition to                            undergo the procedure.                           After obtaining informed consent, the colonoscope  was passed under direct vision. Throughout the                            procedure, the patient's blood pressure, pulse, and                            oxygen saturations were monitored continuously. The                            Olympus PCF-[id number] (#[id number]) Colonoscope was                            introduced through the anus and advanced to the                            cecum, identified by  appendiceal orifice and                            ileocecal valve. The colonoscopy was performed                            without difficulty. The patient tolerated the                            procedure well. The quality of the bowel                            preparation was good. The ileocecal valve,                            appendiceal orifice, and rectum were photographed. Scope In: 9:16:07 AM Scope Out: 9:34:54 AM Scope Withdrawal Time: 0 hours 15 minutes 2 seconds  Total Procedure Duration: 0 hours 18 minutes 47 seconds  Findings:                 The digital rectal exam was normal.                           A 7 mm polyp was found in the cecum. The polyp was                            sessile. The polyp was removed with a cold snare.                            Resection and retrieval were complete.                           A 3 mm polyp was found in the ileocecal valve. The                            polyp was sessile. The polyp was removed with a                            cold snare.  Resection and retrieval were complete.                           Internal hemorrhoids were found during                            retroflexion. The hemorrhoids were small. Complications:            No immediate complications. Estimated Blood Loss:     Estimated blood loss was minimal. Impression:               - One 7 mm polyp in the cecum, removed with a cold                            snare. Resected and retrieved.                           - One 3 mm polyp at the ileocecal valve, removed                            with a cold snare. Resected and retrieved.                           - Small internal hemorrhoids. Recommendation:           - Patient has a contact number available for                            emergencies. The signs and symptoms of potential                            delayed complications were discussed with the                            patient. Return to normal activities  tomorrow.                            Written discharge instructions were provided to the                            patient.                           - Resume previous diet.                           - Continue present medications.                           - Await pathology results.                           - Repeat colonoscopy is recommended. The                            colonoscopy date will be determined after pathology  results from today's exam become available for                            review. Beverley Fiedler, MD 08/13/2022 9:37:25 AM This report has been signed electronically.

## 2022-08-13 NOTE — Progress Notes (Signed)
GASTROENTEROLOGY PROCEDURE H&P NOTE   Primary Care Physician: Cleatis Polka., MD    Reason for Procedure:  Colon cancer screening  Plan:    Colonoscopy  Patient is appropriate for endoscopic procedure(s) in the ambulatory ([facility]) setting.  The nature of the procedure, as well as the risks, benefits, and alternatives were carefully and thoroughly reviewed with the patient. Ample time for discussion and questions allowed. The patient understood, was satisfied, and agreed to proceed.     HPI: Sonya Snyder is a 52 y.o. female who presents for screening colonoscopy.  Medical history as below.  Tolerated the prep.  No recent chest pain or shortness of breath.  No abdominal pain today.  Past Medical History:  Diagnosis Date   Achilles tendinitis    Allergy    Anxiety    Asthma    Asthma    exercise induced   Erythema nodosum    with COC's   GERD (gastroesophageal reflux disease)    Heart murmur    Hiatal hernia    Hypothyroidism    Menorrhagia    MVP (mitral valve prolapse)    PONV (postoperative nausea and vomiting)    Thyroid disease    hypothyroid    Past Surgical History:  Procedure Laterality Date   APPENDECTOMY     ENDOMETRIAL ABLATION W/ NOVASURE     EYE SURGERY     Lasix 2005 done  [place]   STAPEDECTOMY Left 08/09/2013   Procedure: LEFT STAPEDECTOMY;  Surgeon: Serena Colonel, MD;  Location: [facility];  Service: ENT;  Laterality: Left;   VAGINAL HYSTERECTOMY  05/08/2017   added rectocele and cystocele repair, midurethral sling    Prior to Admission medications   Medication Sig Start Date End Date Taking? Authorizing Provider  ALBUTEROL IN Inhale into the lungs. Use as needed   Yes [provider]  buPROPion (WELLBUTRIN XL) 150 MG 24 hr tablet    Yes [provider]  estradiol (VIVELLE-DOT) 0.1 MG/24HR patch Apply 1 patch twice a week by transdermal route for 90 days. 06/16/22  Yes [provider]  levothyroxine  (SYNTHROID, LEVOTHROID) 112 MCG tablet Take 125 mcg by mouth daily.    Yes [provider]  diphenhydrAMINE (BENADRYL) 50 MG tablet Take 50 mg by mouth at bedtime as needed.      [provider]  IBUPROFEN PO Take by mouth.    [provider]  Levocetirizine Dihydrochloride (XYZAL PO) Take by mouth. Patient not taking: Reported on 08/01/2022    [provider]  Pseudoephedrine HCl (SUDAFED 12 HOUR PO) Take by mouth as needed.    [provider]  vortioxetine HBr (TRINTELLIX) 10 MG TABS tablet 1 tablet Orally Once a day for 30 days 06/17/22   [provider]    Current Outpatient Medications  Medication Sig Dispense Refill   ALBUTEROL IN Inhale into the lungs. Use as needed     buPROPion (WELLBUTRIN XL) 150 MG 24 hr tablet      estradiol (VIVELLE-DOT) 0.1 MG/24HR patch Apply 1 patch twice a week by transdermal route for 90 days.     levothyroxine (SYNTHROID, LEVOTHROID) 112 MCG tablet Take 125 mcg by mouth daily.      diphenhydrAMINE (BENADRYL) 50 MG tablet Take 50 mg by mouth at bedtime as needed.       IBUPROFEN PO Take by mouth.     Levocetirizine Dihydrochloride (XYZAL PO) Take by mouth. (Patient not taking: Reported on 08/01/2022)  Pseudoephedrine HCl (SUDAFED 12 HOUR PO) Take by mouth as needed.     vortioxetine HBr (TRINTELLIX) 10 MG TABS tablet 1 tablet Orally Once a day for 30 days     Current Facility-Administered Medications  Medication Dose Route Frequency Provider Last Rate Last Admin   0.9 %  sodium chloride infusion  500 mL Intravenous Once Torrin Frein, Carie Caddy, MD        Allergies as of 08/13/2022 - Review Complete 08/13/2022  Allergen Reaction Noted   Penicillins Hives 02/07/2011   Sulfa antibiotics Hives 02/07/2011   Latex Swelling and Rash 08/24/2012    Family History  Adopted: Yes  Problem Relation Age of Onset   Thyroid disease Mother    Depression Mother    Osteoporosis Mother    Hypertension Father     Diabetes Father    Depression Father    Thyroid disease Sister    Colon cancer Maternal Aunt    Colon cancer Maternal Uncle    Diabetes Other    Colon polyps Neg Hx    Esophageal cancer Neg Hx    Rectal cancer Neg Hx    Stomach cancer Neg Hx     Social History   Socioeconomic History   Marital status: Married    Spouse name: Not on file   Number of children: Not on file   Years of education: Not on file   Highest education level: Not on file  Occupational History   Not on file  Tobacco Use   Smoking status: Never   Smokeless tobacco: Never  Substance and Sexual Activity   Alcohol use: Yes    Comment: occas   Drug use: No   Sexual activity: Yes    Partners: Male    Birth control/protection: Other-see comments, Surgical, Post-menopausal    Comment: ablation  Other Topics Concern   Not on file  Social History Narrative   Not on file   Social Determinants of Health   Financial Resource Strain: Not on file  Food Insecurity: Not on file  Transportation Needs: Not on file  Physical Activity: Not on file  Stress: Not on file  Social Connections: Not on file  Intimate Partner Violence: Not on file    Physical Exam: Vital signs in last 24 hours: @BP  127/76   Pulse (!) 58   Temp (!) 97.5 F (36.4 C)   Ht 5\' 2"  (1.575 m)   Wt 156 lb (70.8 kg)   SpO2 99%   BMI 28.53 kg/m  GEN: NAD EYE: Sclerae anicteric ENT: MMM CV: Non-tachycardic Pulm: CTA b/l GI: Soft, NT/ND NEURO:  Alert & Oriented x 3   Erick Blinks, MD [facility]  08/13/2022 9:08 AM

## 2022-08-13 NOTE — Progress Notes (Signed)
Pt's states no medical or surgical changes since previsit or office visit. 

## 2022-08-13 NOTE — Progress Notes (Signed)
Called to room to assist during endoscopic procedure.  Patient ID and intended procedure confirmed with present staff. Received instructions for my participation in the procedure from the performing physician.  

## 2022-08-13 NOTE — Patient Instructions (Signed)
 **  Handouts given on polyps and hemorrhoids**  YOU HAD AN ENDOSCOPIC PROCEDURE TODAY AT THE [facility]:   Refer to the procedure report that was given to you for any specific questions about what was found during the examination.  If the procedure report does not answer your questions, please call your gastroenterologist to clarify.  If you requested that your care partner not be given the details of your procedure findings, then the procedure report has been included in a sealed envelope for you to review at your convenience later.  YOU SHOULD EXPECT: Some feelings of bloating in the abdomen. Passage of more gas than usual.  Walking can help get rid of the air that was put into your GI tract during the procedure and reduce the bloating. If you had a lower endoscopy (such as a colonoscopy or flexible sigmoidoscopy) you may notice spotting of blood in your stool or on the toilet paper. If you underwent a bowel prep for your procedure, you may not have a normal bowel movement for a few days.  Please Note:  You might notice some irritation and congestion in your nose or some drainage.  This is from the oxygen used during your procedure.  There is no need for concern and it should clear up in a day or so.  SYMPTOMS TO REPORT IMMEDIATELY:  Following lower endoscopy (colonoscopy or flexible sigmoidoscopy):  Excessive amounts of blood in the stool  Significant tenderness or worsening of abdominal pains  Swelling of the abdomen that is new, acute  Fever of 100F or higher  For urgent or emergent issues, a gastroenterologist can be reached at any hour by calling [phone]. Do not use MyChart messaging for urgent concerns.    DIET:  We do recommend a small meal at first, but then you may proceed to your regular diet.  Drink plenty of fluids but you should avoid alcoholic beverages for 24 hours.  ACTIVITY:  You should plan to take it easy for the rest of today and you should NOT  DRIVE or use heavy machinery until tomorrow (because of the sedation medicines used during the test).    FOLLOW UP: Our staff will call the number listed on your records the next business day following your procedure.  We will call around 7:15- 8:00 am to check on you and address any questions or concerns that you may have regarding the information given to you following your procedure. If we do not reach you, we will leave a message.     If any biopsies were taken you will be contacted by phone or by letter within the next 1-3 weeks.  Please call us at [phone] if you have not heard about the biopsies in 3 weeks.    SIGNATURES/CONFIDENTIALITY: You and/or your care partner have signed paperwork which will be entered into your electronic medical record.  These signatures attest to the fact that that the information above on your After Visit Summary has been reviewed and is understood.  Full responsibility of the confidentiality of this discharge information lies with you and/or your care-partner. 

## 2022-08-13 NOTE — Progress Notes (Signed)
Vss nad trans to pacu 

## 2022-08-14 ENCOUNTER — Telehealth: Payer: Self-pay

## 2022-08-14 NOTE — Telephone Encounter (Signed)
Attempted follow up call to pt; phone rang several times, did not go to vm.

## 2022-08-20 ENCOUNTER — Encounter: Payer: Self-pay | Admitting: Internal Medicine

## 2022-09-01 DIAGNOSIS — R7309 Other abnormal glucose: Secondary | ICD-10-CM | POA: Diagnosis not present

## 2022-10-02 DIAGNOSIS — R7309 Other abnormal glucose: Secondary | ICD-10-CM | POA: Diagnosis not present

## 2022-11-02 DIAGNOSIS — R7309 Other abnormal glucose: Secondary | ICD-10-CM | POA: Diagnosis not present

## 2022-12-02 DIAGNOSIS — R7309 Other abnormal glucose: Secondary | ICD-10-CM | POA: Diagnosis not present

## 2023-01-02 DIAGNOSIS — R7309 Other abnormal glucose: Secondary | ICD-10-CM | POA: Diagnosis not present

## 2023-02-01 DIAGNOSIS — R7309 Other abnormal glucose: Secondary | ICD-10-CM | POA: Diagnosis not present

## 2023-06-04 DIAGNOSIS — M8580 Other specified disorders of bone density and structure, unspecified site: Secondary | ICD-10-CM | POA: Diagnosis not present

## 2023-06-04 DIAGNOSIS — M8589 Other specified disorders of bone density and structure, multiple sites: Secondary | ICD-10-CM | POA: Diagnosis not present

## 2023-06-04 DIAGNOSIS — E7800 Pure hypercholesterolemia, unspecified: Secondary | ICD-10-CM | POA: Diagnosis not present

## 2023-06-12 DIAGNOSIS — Z0000 Encounter for general adult medical examination without abnormal findings: Secondary | ICD-10-CM | POA: Diagnosis not present

## 2023-06-12 DIAGNOSIS — Z1339 Encounter for screening examination for other mental health and behavioral disorders: Secondary | ICD-10-CM | POA: Diagnosis not present

## 2023-06-12 DIAGNOSIS — E039 Hypothyroidism, unspecified: Secondary | ICD-10-CM | POA: Diagnosis not present

## 2023-06-12 DIAGNOSIS — Z1331 Encounter for screening for depression: Secondary | ICD-10-CM | POA: Diagnosis not present

## 2023-06-17 DIAGNOSIS — Z01411 Encounter for gynecological examination (general) (routine) with abnormal findings: Secondary | ICD-10-CM | POA: Diagnosis not present

## 2023-06-17 DIAGNOSIS — L292 Pruritus vulvae: Secondary | ICD-10-CM | POA: Diagnosis not present

## 2023-06-17 DIAGNOSIS — Z1331 Encounter for screening for depression: Secondary | ICD-10-CM | POA: Diagnosis not present

## 2023-06-17 DIAGNOSIS — E8941 Symptomatic postprocedural ovarian failure: Secondary | ICD-10-CM | POA: Diagnosis not present

## 2024-01-25 ENCOUNTER — Other Ambulatory Visit: Payer: Self-pay | Admitting: Obstetrics

## 2024-01-25 DIAGNOSIS — Z1231 Encounter for screening mammogram for malignant neoplasm of breast: Secondary | ICD-10-CM

## 2024-02-11 DIAGNOSIS — H43823 Vitreomacular adhesion, bilateral: Secondary | ICD-10-CM | POA: Diagnosis not present

## 2024-02-11 DIAGNOSIS — H35462 Secondary vitreoretinal degeneration, left eye: Secondary | ICD-10-CM | POA: Diagnosis not present

## 2024-02-11 DIAGNOSIS — H35413 Lattice degeneration of retina, bilateral: Secondary | ICD-10-CM | POA: Diagnosis not present

## 2024-02-11 DIAGNOSIS — H2513 Age-related nuclear cataract, bilateral: Secondary | ICD-10-CM | POA: Diagnosis not present

## 2024-02-22 ENCOUNTER — Ambulatory Visit

## 2024-02-22 DIAGNOSIS — Z1231 Encounter for screening mammogram for malignant neoplasm of breast: Secondary | ICD-10-CM
